# Patient Record
Sex: Male | Born: 1955 | Race: White | Hispanic: No | Marital: Married | Smoking: Former smoker
Health system: Southern US, Community
[De-identification: ages and names within clinical notes are randomized; demographics above are authoritative.]

## PROBLEM LIST (undated history)

## (undated) HISTORY — PX: NASAL SINUS SURGERY: SHX719

---

## 1998-02-23 ENCOUNTER — Ambulatory Visit (HOSPITAL_COMMUNITY): Admission: RE | Admit: 1998-02-23 | Discharge: 1998-02-23 | Payer: Self-pay | Admitting: Gastroenterology

## 1998-03-29 ENCOUNTER — Other Ambulatory Visit: Admission: RE | Admit: 1998-03-29 | Discharge: 1998-03-29 | Payer: Self-pay

## 1998-04-11 ENCOUNTER — Ambulatory Visit (HOSPITAL_COMMUNITY): Admission: RE | Admit: 1998-04-11 | Discharge: 1998-04-11 | Payer: Self-pay | Admitting: Gastroenterology

## 1999-09-04 HISTORY — PX: NASAL SINUS SURGERY: SHX719

## 2004-12-06 ENCOUNTER — Ambulatory Visit: Payer: Self-pay | Admitting: Internal Medicine

## 2005-05-01 ENCOUNTER — Ambulatory Visit: Payer: Self-pay | Admitting: Internal Medicine

## 2005-10-01 ENCOUNTER — Ambulatory Visit: Payer: Self-pay | Admitting: Internal Medicine

## 2005-10-02 ENCOUNTER — Ambulatory Visit: Payer: Self-pay | Admitting: Internal Medicine

## 2006-01-31 ENCOUNTER — Ambulatory Visit: Payer: Self-pay | Admitting: Internal Medicine

## 2006-05-23 ENCOUNTER — Ambulatory Visit: Payer: Self-pay | Admitting: Internal Medicine

## 2006-09-12 ENCOUNTER — Ambulatory Visit: Payer: Self-pay | Admitting: Internal Medicine

## 2007-01-16 ENCOUNTER — Ambulatory Visit: Payer: Self-pay | Admitting: Internal Medicine

## 2007-06-13 ENCOUNTER — Ambulatory Visit: Payer: Self-pay | Admitting: Internal Medicine

## 2008-04-27 DIAGNOSIS — J302 Other seasonal allergic rhinitis: Secondary | ICD-10-CM | POA: Insufficient documentation

## 2008-04-27 DIAGNOSIS — J3089 Other allergic rhinitis: Secondary | ICD-10-CM

## 2008-04-28 ENCOUNTER — Ambulatory Visit: Payer: Self-pay | Admitting: Internal Medicine

## 2008-04-29 ENCOUNTER — Ambulatory Visit: Payer: Self-pay | Admitting: Internal Medicine

## 2008-08-25 ENCOUNTER — Ambulatory Visit: Payer: Self-pay | Admitting: Internal Medicine

## 2009-01-24 ENCOUNTER — Ambulatory Visit: Payer: Self-pay | Admitting: Internal Medicine

## 2009-04-28 ENCOUNTER — Ambulatory Visit: Payer: Self-pay | Admitting: Internal Medicine

## 2009-04-29 ENCOUNTER — Ambulatory Visit: Payer: Self-pay | Admitting: Internal Medicine

## 2009-09-14 ENCOUNTER — Ambulatory Visit: Payer: Self-pay | Admitting: Internal Medicine

## 2010-01-02 ENCOUNTER — Ambulatory Visit: Payer: Self-pay | Admitting: Internal Medicine

## 2010-04-28 ENCOUNTER — Ambulatory Visit: Payer: Self-pay | Admitting: Internal Medicine

## 2010-05-31 ENCOUNTER — Ambulatory Visit: Payer: Self-pay | Admitting: Internal Medicine

## 2010-10-03 NOTE — Assessment & Plan Note (Signed)
Summary: 12 months/apc   Primary Provider/Referring Provider:  [facility] [place]  CC:  Yearly follow up visit-alleriges. no complaints..  History of Present Illness: 04/28/08- 55 year old man returning for follow-up of allergic rhinitis. no history of asthma.  Remote smoking history.  He had been on allergy vaccine, started at the old office and being given at 1:10 by his daughter with no problems.  He dropped off in December.  Since then.  He has felt tired, run down with itching of nose, and skin.  He feels this changes due to being off of allergy vaccine and he wants to restart. Denies purulent discharge, headache, wheezing, chest pain or palpitation, adenopathy, or rash. Admits difficulty initiating and maintaining sleep.  Not sure about snoring, status.  Not clear that his sense of "dragged down"  is really sleepiness.  Has not recognized oither health problems.  There is past history of sinus surgery.  8.26/10- allergic rhinitis Continues allergy shots- daughter or mother gives his injections with no problems and he rarely needs claritin. Only occasional sneeze. Feels dragging, blames hot weather. he is satisfied with repiratory symptom control.  April 28, 2010- Allergic rhinitis Mother continues to give his shots and he says as long as he gets his shot once a week he is fine. denies need for any supplemental meds- no longer needs Claritin. Denies concerns, headche, sinus congestion, wheeze of cough. On no treatment for any other medical treatment. His wife and daughter   (nurse) give his shots with no problems or reactions.    Preventive Screening-Counseling & Management  Alcohol-Tobacco     Smoking Status: quit     Year Quit: 35 years ago     Pack years: 4 years x 1ppd  Current Medications (verified): 1)  Epipen 0.3 Mg/0.41ml (1:1000) Devi (Epinephrine Hcl (Anaphylaxis)) .... For Severe Allergic Reaction 2)  Allergy Vaccine 1:10 Dtr Gives (W-E). .... 0.6 Ml/ Vial/  Week  Allergies (verified): No Known Drug Allergies  Social History: Pack years:  4 years x 1ppd  Review of Systems  The patient denies shortness of breath with activity, shortness of breath at rest, productive cough, non-productive cough, coughing up blood, chest pain, irregular heartbeats, acid heartburn, indigestion, loss of appetite, weight change, abdominal pain, difficulty swallowing, sore throat, tooth/dental problems, headaches, nasal congestion/difficulty breathing through nose, and sneezing.    Vital Signs:  Patient profile:   55 year old male Height:      71 inches Weight:      213 pounds BMI:     29.81 O2 Sat:      98 % on Room air Pulse rate:   71 / minute BP sitting:   122 / 72  (left arm) Cuff size:   regular  Vitals Entered By: Reynaldo Minium CMA (April 28, 2010 9:10 AM)  O2 Flow:  Room air CC: Yearly follow up visit-alleriges. no complaints.   Physical Exam  Additional Exam:  General: A/Ox3; pleasant and cooperative, NAD, SKIN: no rash, lesions NODES: no lymphadenopathy HEENT: [place]/AT, EOM- WNL, Conjuctivae- clear, PERRLA, TM-WNL, Nose- clear, Throat- clear and wnl, Mallampati III NECK: Supple w/ fair ROM, JVD- none, normal carotid impulses w/o bruits Thyroid-  CHEST: Clear to P&A HEART: RRR, no m/g/r heard ABDOMEN: Soft and nl;  UVO:ZDGU, nl pulses, no edema  NEURO: Grossly intact to observation      Impression & Recommendations:  Problem # 1:  ALLERGIC RHINITIS (ICD-477.9)  Doing very well. He is pleased and wants no changes. We will  renew his Epipen and continuue allergy shots. The following medications were removed from the medication list:    Claritin 10 Mg Tabs (Loratadine) .Marland Kitchen... Take 1 tablet by mouth once a day as needed  Other Orders: Est. Patient Level III (16109)  Patient Instructions: 1)  Please schedule a follow-up appointment in 1 year. 2)  Continue allergy vaccine 3)  Refill script for Epipen to hold in case of severe allergic  reaction to allergy vaccine 4)  You can take an otc antihistamine as needed for sneeze, itch or other allergy symptoms: claritin/ lotatadine, Allegra/ fexofenadine, Zyrtec/ cetirizine are all popular. Prescriptions: EPIPEN 0.3 MG/0.3ML (1:1000) DEVI (EPINEPHRINE HCL (ANAPHYLAXIS)) For severe allergic reaction  #1 x prn   Entered and Authorized by:   Waymon Budge MD   Signed by:   Waymon Budge MD on 04/28/2010   Method used:   Print then Give to Patient   RxID:   [id number]

## 2011-01-17 ENCOUNTER — Ambulatory Visit (INDEPENDENT_AMBULATORY_CARE_PROVIDER_SITE_OTHER): Payer: Self-pay

## 2011-01-17 DIAGNOSIS — J309 Allergic rhinitis, unspecified: Secondary | ICD-10-CM

## 2011-05-17 ENCOUNTER — Ambulatory Visit (INDEPENDENT_AMBULATORY_CARE_PROVIDER_SITE_OTHER): Payer: Self-pay

## 2011-05-17 DIAGNOSIS — J309 Allergic rhinitis, unspecified: Secondary | ICD-10-CM

## 2011-06-14 ENCOUNTER — Encounter: Payer: Self-pay | Admitting: Internal Medicine

## 2011-06-14 ENCOUNTER — Ambulatory Visit (INDEPENDENT_AMBULATORY_CARE_PROVIDER_SITE_OTHER): Payer: Self-pay | Admitting: Internal Medicine

## 2011-06-14 VITALS — BP 122/76 | HR 59 | Ht 71.0 in | Wt 206.2 lb

## 2011-06-14 DIAGNOSIS — J309 Allergic rhinitis, unspecified: Secondary | ICD-10-CM

## 2011-06-14 MED ORDER — EPINEPHRINE 0.3 MG/0.3ML IJ DEVI: 0.3000 mg | Freq: Once | INTRAMUSCULAR | Status: AC

## 2011-06-14 NOTE — Patient Instructions (Signed)
Continue allergy vaccine- call as needed

## 2011-06-14 NOTE — Progress Notes (Signed)
06/14/11-55 year old male former smoker followed for allergic rhinitis without asthma Last here -04/28/2010 He feels he is doing very well with no acute problems. We reviewed his history on allergy vaccine. His daughter is a Engineer, civil (consulting), giving his shots. He had tried stopping his shots in 2009 and experienced progressive itching and malaise which were relieved when he restarted. He continues at 0.6 mL per vial per week without complication or concern.  ROS See HPI Constitutional:   No-   weight loss, night sweats, fevers, chills, fatigue, lassitude. HEENT:   No-  headaches, difficulty swallowing, tooth/dental problems, sore throat,       No-  sneezing, itching, ear ache, nasal congestion, post nasal drip,  CV:  No-   chest pain, orthopnea, PND, swelling in lower extremities, anasarca, dizziness, palpitations Resp: No-   shortness of breath with exertion or at rest.              No-   productive cough,  No non-productive cough,  No-  coughing up of blood.              No-   change in color of mucus.  No- wheezing.   Skin: No-   rash or lesions. GI:  No-   heartburn, indigestion, abdominal pain, nausea, vomiting, diarrhea,                 change in bowel habits, loss of appetite GU: No-   dysuria, change in color of urine, no urgency or frequency.  No- flank pain. MS:  No-   joint pain or swelling.  No- decreased range of motion.  No- back pain. Neuro-  Psych:  No- change in mood or affect. No depression or anxiety.  No memory loss.  OBJ General- Alert, Oriented, Affect-appropriate, Distress- none acute, well-appearing Skin- rash-none, lesions- none, excoriation- none Lymphadenopathy- none Head- atraumatic            Eyes- Gross vision intact, PERRLA, conjunctivae clear secretions            Ears- Hearing, canals-normal            Nose- Clear, no-Septal dev, mucus, polyps, erosion, perforation             Throat- Mallampati II , mucosa clear , drainage- none, tonsils- atrophic Neck- flexible ,  trachea midline, no stridor , thyroid nl, carotid no bruit Chest - symmetrical excursion , unlabored           Heart/CV- RRR , no murmur , no gallop  , no rub, nl s1 s2                           - JVD- none , edema- none, stasis changes- none, varices- none           Lung- clear to P&A, wheeze- none, cough- none , dullness-none, rub- none           Chest wall-  Abd- tender-no, distended-no, bowel sounds-present, HSM- no Br/ Gen/ Rectal- Not done, not indicated Extrem- cyanosis- none, clubbing, none, atrophy- none, strength- nl Neuro- grossly intact to observation

## 2011-06-16 ENCOUNTER — Encounter: Payer: Self-pay | Admitting: Internal Medicine

## 2011-06-16 NOTE — Assessment & Plan Note (Signed)
We discussed goals and strategy for allergy vaccine as well as decisions about discontinuing. He is happier continuing his shots at this time.

## 2011-06-20 ENCOUNTER — Ambulatory Visit (INDEPENDENT_AMBULATORY_CARE_PROVIDER_SITE_OTHER): Payer: Self-pay

## 2011-06-20 DIAGNOSIS — J309 Allergic rhinitis, unspecified: Secondary | ICD-10-CM

## 2012-01-09 ENCOUNTER — Ambulatory Visit (INDEPENDENT_AMBULATORY_CARE_PROVIDER_SITE_OTHER): Payer: Self-pay

## 2012-01-09 DIAGNOSIS — J309 Allergic rhinitis, unspecified: Secondary | ICD-10-CM

## 2012-05-21 ENCOUNTER — Ambulatory Visit (INDEPENDENT_AMBULATORY_CARE_PROVIDER_SITE_OTHER): Payer: Self-pay

## 2012-05-21 DIAGNOSIS — J309 Allergic rhinitis, unspecified: Secondary | ICD-10-CM

## 2012-07-16 ENCOUNTER — Encounter: Payer: Self-pay | Admitting: Internal Medicine

## 2012-07-16 ENCOUNTER — Ambulatory Visit (INDEPENDENT_AMBULATORY_CARE_PROVIDER_SITE_OTHER): Payer: Self-pay | Admitting: Internal Medicine

## 2012-07-16 VITALS — BP 120/74 | HR 61 | Ht 71.0 in | Wt 209.8 lb

## 2012-07-16 DIAGNOSIS — J309 Allergic rhinitis, unspecified: Secondary | ICD-10-CM

## 2012-07-16 NOTE — Patient Instructions (Addendum)
We can continue your allergy vaccine at 1:10     Please call as needed

## 2012-07-16 NOTE — Progress Notes (Signed)
06/14/11-56 year old male former smoker followed for allergic rhinitis without asthma Last here -04/28/2010 He feels he is doing very well with no acute problems. We reviewed his history on allergy vaccine. His daughter is a Engineer, civil (consulting), giving his shots. He had tried stopping his shots in 2009 and experienced progressive itching and malaise which were relieved when he restarted. He continues at 0.6 mL per vial per week without complication or concern.  07/16/12- 56 year old male former smoker followed for allergic rhinitis without asthma    Here with mother Still on allergy vaccine 1:10 mother gives,  and doing well; no reactions or flare ups at this time. He feels he is much better with allergy vaccine than without. No reactions but does have EpiPen kept at his mother,s where he gets his shots.  ROS-See HPI Constitutional:   No-   weight loss, night sweats, fevers, chills, fatigue, lassitude. HEENT:   No-  headaches, difficulty swallowing, tooth/dental problems, sore throat,       No-  sneezing, itching, ear ache, nasal congestion, post nasal drip,  CV:  No-   chest pain, orthopnea, PND, swelling in lower extremities, anasarca, dizziness, palpitations Resp: No-   shortness of breath with exertion or at rest.              No-   productive cough,  No non-productive cough,  No-  coughing up of blood.              No-   change in color of mucus.  No- wheezing.   Skin: No-   rash or lesions. GI:  No-   heartburn, indigestion, abdominal pain, nausea, vomiting,  GU: . MS:  No-   joint pain or swelling.   Neuro-  Psych:  No- change in mood or affect. No depression or anxiety.  No memory loss.  OBJ General- Alert, Oriented, Affect-appropriate, Distress- none acute, well-appearing Skin- rash-none, lesions- none, excoriation- none Lymphadenopathy- none Head- atraumatic            Eyes- Gross vision intact, PERRLA, conjunctivae clear secretions            Ears- Hearing, canals-normal            Nose-  Clear, no-Septal dev, mucus, polyps, erosion, perforation             Throat- Mallampati III , mucosa clear , drainage- none, tonsils- atrophic Neck- flexible , trachea midline, no stridor , thyroid nl, carotid no bruit Chest - symmetrical excursion , unlabored           Heart/CV- RRR , no murmur , no gallop  , no rub, nl s1 s2                           - JVD- none , edema- none, stasis changes- none, varices- none           Lung- clear to P&A, wheeze- none, cough- none , dullness-none, rub- none           Chest wall-  Abd- Br/ Gen/ Rectal- Not done, not indicated Extrem- cyanosis- none, clubbing, none, atrophy- none, strength- nl Neuro- grossly intact to observation

## 2012-07-26 NOTE — Assessment & Plan Note (Signed)
Well controlled. No changes or concerns. We reviewed the risk and benefit considerations

## 2012-09-17 ENCOUNTER — Ambulatory Visit (INDEPENDENT_AMBULATORY_CARE_PROVIDER_SITE_OTHER): Payer: Self-pay

## 2012-09-17 DIAGNOSIS — J309 Allergic rhinitis, unspecified: Secondary | ICD-10-CM

## 2013-01-12 ENCOUNTER — Ambulatory Visit (INDEPENDENT_AMBULATORY_CARE_PROVIDER_SITE_OTHER): Payer: Self-pay

## 2013-01-12 DIAGNOSIS — J309 Allergic rhinitis, unspecified: Secondary | ICD-10-CM

## 2013-04-23 ENCOUNTER — Ambulatory Visit (INDEPENDENT_AMBULATORY_CARE_PROVIDER_SITE_OTHER): Payer: Self-pay

## 2013-04-23 DIAGNOSIS — J309 Allergic rhinitis, unspecified: Secondary | ICD-10-CM

## 2013-07-16 ENCOUNTER — Ambulatory Visit (INDEPENDENT_AMBULATORY_CARE_PROVIDER_SITE_OTHER): Payer: Self-pay | Admitting: Internal Medicine

## 2013-07-16 ENCOUNTER — Encounter: Payer: Self-pay | Admitting: Internal Medicine

## 2013-07-16 VITALS — BP 118/72 | HR 70 | Ht 71.0 in | Wt 210.6 lb

## 2013-07-16 DIAGNOSIS — J309 Allergic rhinitis, unspecified: Secondary | ICD-10-CM

## 2013-07-16 NOTE — Patient Instructions (Signed)
We can continue allergy vaccine 1:10 GO  Please call as needed 

## 2013-07-16 NOTE — Progress Notes (Signed)
06/14/11-57 year old male former smoker followed for allergic rhinitis without asthma Last here -04/28/2010 He feels he is doing very well with no acute problems. We reviewed his history on allergy vaccine. His daughter is a Engineer, civil (consulting), giving his shots. He had tried stopping his shots in 2009 and experienced progressive itching and malaise which were relieved when he restarted. He continues at 0.6 mL per vial per week without complication or concern.  07/16/12- 57 year old male former smoker followed for allergic rhinitis without asthma    Here with mother Still on allergy vaccine 1:10 mother gives,  and doing well; no reactions or flare ups at this time. He feels he is much better with allergy vaccine than without. No reactions but does have EpiPen kept at his mother,s where he gets his shots.  07/16/13- 57 year old male former smoker followed for allergic rhinitis without asthma    Here with mother Still on allergy vaccine 1:10 mother gives, FOLLOWS FOR:  Doing well, still on vaccine.  No concerns today He declines flu shot-discussed. Breathing feels stable. 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 No routine cough or wheeze.  ROS-See HPI Constitutional:   No-   weight loss, night sweats, fevers, chills, fatigue, lassitude. HEENT:   No-  headaches, difficulty swallowing, tooth/dental problems, sore throat,       No-  sneezing, itching, ear ache, nasal congestion, post nasal drip,  CV:  No-   chest pain, orthopnea, PND, swelling in lower extremities, anasarca, dizziness, palpitations Resp: No-   shortness of breath with exertion or at rest.              No-   productive cough,  No non-productive cough,  No-  coughing up of blood.              No-   change in color of mucus.  No- wheezing.   Skin: No-   rash or lesions. GI:  No-   heartburn, indigestion, abdominal pain, nausea, vomiting,  GU: . MS:  No-   joint pain or swelling.   Neuro-  Psych:  No- change in mood or affect.  No depression or anxiety.  No memory loss.  OBJ General- Alert, Oriented, Affect-appropriate, Distress- none acute, well-appearing Skin- rash-none, lesions- none, excoriation- none Lymphadenopathy- none Head- atraumatic            Eyes- Gross vision intact, PERRLA, conjunctivae clear secretions            Ears- Hearing, canals-normal            Nose- Clear, no-Septal dev, mucus+ , polyps, erosion, perforation             Throat- Mallampati III , mucosa clear , drainage- none, tonsils- atrophic Neck- flexible , trachea midline, no stridor , thyroid nl, carotid no bruit Chest - symmetrical excursion , unlabored           Heart/CV- RRR , no murmur , no gallop  , no rub, nl s1 s2                           - JVD- none , edema- none, stasis changes- none, varices- none           Lung- clear to P&A, wheeze- none, cough- none , dullness-none, rub- none           Chest wall-  Abd- Br/ Gen/ Rectal- Not done, not indicated Extrem- cyanosis- none, clubbing, none, atrophy-  none, strength- nl Neuro- grossly intact to observation

## 2013-07-20 ENCOUNTER — Ambulatory Visit (INDEPENDENT_AMBULATORY_CARE_PROVIDER_SITE_OTHER): Payer: Self-pay

## 2013-07-20 DIAGNOSIS — J309 Allergic rhinitis, unspecified: Secondary | ICD-10-CM

## 2013-08-01 NOTE — Assessment & Plan Note (Signed)
Seasonal and perennial allergic rhinitis. He does well with allergy vaccine and is satisfied to continue.

## 2013-10-26 ENCOUNTER — Ambulatory Visit (INDEPENDENT_AMBULATORY_CARE_PROVIDER_SITE_OTHER): Payer: Self-pay

## 2013-10-26 DIAGNOSIS — J309 Allergic rhinitis, unspecified: Secondary | ICD-10-CM

## 2014-02-11 ENCOUNTER — Ambulatory Visit (INDEPENDENT_AMBULATORY_CARE_PROVIDER_SITE_OTHER): Payer: Self-pay

## 2014-02-11 DIAGNOSIS — J309 Allergic rhinitis, unspecified: Secondary | ICD-10-CM

## 2014-06-21 ENCOUNTER — Ambulatory Visit (INDEPENDENT_AMBULATORY_CARE_PROVIDER_SITE_OTHER): Payer: Self-pay

## 2014-06-21 DIAGNOSIS — J309 Allergic rhinitis, unspecified: Secondary | ICD-10-CM

## 2014-07-08 ENCOUNTER — Encounter: Payer: Self-pay | Admitting: Internal Medicine

## 2014-07-14 ENCOUNTER — Encounter: Payer: Self-pay | Admitting: Internal Medicine

## 2014-07-14 ENCOUNTER — Ambulatory Visit (INDEPENDENT_AMBULATORY_CARE_PROVIDER_SITE_OTHER): Payer: Self-pay | Admitting: Internal Medicine

## 2014-07-14 VITALS — BP 122/86 | HR 75 | Ht 71.0 in | Wt 215.4 lb

## 2014-07-14 DIAGNOSIS — J3089 Other allergic rhinitis: Principal | ICD-10-CM

## 2014-07-14 DIAGNOSIS — J302 Other seasonal allergic rhinitis: Secondary | ICD-10-CM

## 2014-07-14 DIAGNOSIS — J309 Allergic rhinitis, unspecified: Secondary | ICD-10-CM

## 2014-07-14 NOTE — Patient Instructions (Signed)
We can continue allergy vaccine 1:10 GO  Please call as needed 

## 2014-07-14 NOTE — Progress Notes (Signed)
06/14/11-58 year old male former smoker followed for allergic rhinitis without asthma Last here -04/28/2010 He feels he is doing very well with no acute problems. We reviewed his history on allergy vaccine. His daughter is a Engineer, civil (consulting)nurse, giving his shots. He had tried stopping his shots in 2009 and experienced progressive itching and malaise which were relieved when he restarted. He continues at 0.6 mL per vial per week without complication or concern.  07/16/12- 58 year old male former smoker followed for allergic rhinitis without asthma    Here with mother Still on allergy vaccine 1:10 mother gives,  and doing well; no reactions or flare ups at this time. He feels he is much better with allergy vaccine than without. No reactions but does have EpiPen kept at his mother,s where he gets his shots.  07/16/13- 58 year old male former smoker followed for allergic rhinitis without asthma    Here with mother Still on allergy vaccine 1:10 mother gives, FOLLOWS FOR:  Doing well, still on vaccine.  No concerns today He declines flu shot-discussed. Breathing feels stable. 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 No routine cough or wheeze.  07/14/14- 58 year old male former smoker followed for allergic rhinitis without asthma    Here with mother Yates DecampJoan Coble Allergy vaccine 1:10 GO/ Dtr gives He has never had asthma. Satisfied to continue current treatment.  ROS-See HPI Constitutional:   No-   weight loss, night sweats, fevers, chills, fatigue, lassitude. HEENT:   No-  headaches, difficulty swallowing, tooth/dental problems, sore throat,       No-  sneezing, itching, ear ache, nasal congestion, post nasal drip,  CV:  No-   chest pain, orthopnea, PND, swelling in lower extremities, anasarca, dizziness, palpitations Resp: No-   shortness of breath with exertion or at rest.              No-   productive cough,  No non-productive cough,  No-  coughing up of blood.              No-   change in  color of mucus.  No- wheezing.   Skin: No-   rash or lesions. GI:  No-   heartburn, indigestion, abdominal pain, nausea, vomiting,  GU: . MS:  No-   joint pain or swelling.   Neuro-  Psych:  No- change in mood or affect. No depression or anxiety.  No memory loss.  OBJ General- Alert, Oriented, Affect-appropriate, Distress- none acute, well-appearing Skin- rash-none, lesions- none, excoriation- none Lymphadenopathy- none Head- atraumatic            Eyes- Gross vision intact, PERRLA, conjunctivae clear secretions            Ears- Hearing, canals-normal            Nose- Clear, no-Septal dev, mucus+ , polyps, erosion, perforation             Throat- Mallampati III , mucosa clear , drainage- none, tonsils- atrophic Neck- flexible , trachea midline, no stridor , thyroid nl, carotid no bruit Chest - symmetrical excursion , unlabored           Heart/CV- RRR , no murmur , no gallop  , no rub, nl s1 s2                           - JVD- none , edema- none, stasis changes- none, varices- none           Lung- clear to  P&A, wheeze- none, cough- none , dullness-none, rub- none           Chest wall-  Abd- Br/ Gen/ Rectal- Not done, not indicated Extrem- cyanosis- none, clubbing, none, atrophy- none, strength- nl Neuro- grossly intact to observation

## 2014-07-14 NOTE — Assessment & Plan Note (Signed)
He indicates confidence that allergy vaccine still works for him and is worth continuing. Little need for more than occasional supplemental antihistamine. He declines flu vaccine-discussed.

## 2014-10-13 ENCOUNTER — Ambulatory Visit (INDEPENDENT_AMBULATORY_CARE_PROVIDER_SITE_OTHER): Payer: Self-pay

## 2014-10-13 DIAGNOSIS — J309 Allergic rhinitis, unspecified: Secondary | ICD-10-CM

## 2015-02-01 ENCOUNTER — Telehealth: Payer: Self-pay | Admitting: Internal Medicine

## 2015-02-02 ENCOUNTER — Ambulatory Visit (INDEPENDENT_AMBULATORY_CARE_PROVIDER_SITE_OTHER): Payer: Self-pay

## 2015-02-02 ENCOUNTER — Telehealth: Payer: Self-pay | Admitting: Internal Medicine

## 2015-02-02 DIAGNOSIS — J309 Allergic rhinitis, unspecified: Secondary | ICD-10-CM

## 2015-02-02 NOTE — Telephone Encounter (Signed)
Barry Campbell  called my ext. Yesterday but did not leave a message. I wrote down the # and pulled up her chart.(so I'd know who I was talking to.)  When I called(02/01/15 am) she preceded to tell me she was calling about her son's vac.. I explained he should get it either 02/01/15 or today. Nothing further was needed.

## 2015-03-02 NOTE — Telephone Encounter (Signed)
Date Mixed: 02/02/15 Vial: 1 Strength: 1:10 Here/Mail/Pick Up: mail Mixed By: Darlyne Russiantbs

## 2015-04-22 ENCOUNTER — Encounter: Payer: Self-pay | Admitting: Internal Medicine

## 2015-06-01 ENCOUNTER — Telehealth: Payer: Self-pay | Admitting: Internal Medicine

## 2015-06-01 ENCOUNTER — Ambulatory Visit (INDEPENDENT_AMBULATORY_CARE_PROVIDER_SITE_OTHER): Payer: Self-pay

## 2015-06-01 DIAGNOSIS — J309 Allergic rhinitis, unspecified: Secondary | ICD-10-CM

## 2015-06-01 NOTE — Telephone Encounter (Signed)
Allergy Serum Extract Date Mixed: 05/31/1620 Vial: 1 Strength: 1:10 Here/Mail/Pick Up: mail Mixed By: Darlyne Russian

## 2015-07-19 ENCOUNTER — Encounter: Payer: Self-pay | Admitting: Internal Medicine

## 2015-09-14 ENCOUNTER — Ambulatory Visit: Payer: Self-pay | Admitting: Internal Medicine

## 2015-09-22 ENCOUNTER — Ambulatory Visit (INDEPENDENT_AMBULATORY_CARE_PROVIDER_SITE_OTHER): Payer: Self-pay | Admitting: Internal Medicine

## 2015-09-22 ENCOUNTER — Encounter: Payer: Self-pay | Admitting: Internal Medicine

## 2015-09-22 VITALS — BP 120/78 | HR 74 | Ht 71.0 in | Wt 203.8 lb

## 2015-09-22 DIAGNOSIS — J3089 Other allergic rhinitis: Principal | ICD-10-CM

## 2015-09-22 DIAGNOSIS — J309 Allergic rhinitis, unspecified: Secondary | ICD-10-CM

## 2015-09-22 DIAGNOSIS — J302 Other seasonal allergic rhinitis: Secondary | ICD-10-CM

## 2015-09-22 NOTE — Patient Instructions (Signed)
We can continue allergy vaccine 1:10 GO  Please call as needed 

## 2015-09-22 NOTE — Progress Notes (Signed)
06/14/11-60 year old male former smoker followed for allergic rhinitis without asthma Last here -04/28/2010 He feels he is doing very well with no acute problems. We reviewed his history on allergy vaccine. His daughter is a Engineer, civil (consulting), giving his shots. He had tried stopping his shots in 2009 and experienced progressive itching and malaise which were relieved when he restarted. He continues at 0.6 mL per vial per week without complication or concern.  07/16/12- 60 year old male former smoker followed for allergic rhinitis without asthma    Here with mother Still on allergy vaccine 1:10 mother gives,  and doing well; no reactions or flare ups at this time. He feels he is much better with allergy vaccine than without. No reactions but does have EpiPen kept at his mother,s where he gets his shots.  07/16/13- 60 year old male former smoker followed for allergic rhinitis without asthma    Here with mother Still on allergy vaccine 1:10 mother gives, FOLLOWS FOR:  Doing well, still on vaccine.  No concerns today He declines flu shot-discussed. Breathing feels stable. 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 No routine cough or wheeze.  07/14/14- 60 year old male former smoker followed for allergic rhinitis without asthma    Here with mother Barry Campbell Allergy vaccine 1:10 GO/ Dtr gives He has never had asthma. Satisfied to continue current treatment.  12/21/2015-60 year old male former smoker followed for allergic rhinitis without asthma Here with mother Barry Campbell Allergy vaccine 1:10 GO/daughter gives He is satisfied to continue allergy vaccine and feels he is having fewer respiratory problems while doing so. He denies other health concerns.  ROS-See HPI Constitutional:   No-   weight loss, night sweats, fevers, chills, fatigue, lassitude. HEENT:   No-  headaches, difficulty swallowing, tooth/dental problems, sore throat,       No-  sneezing, itching, ear ache, nasal congestion,  post nasal drip,  CV:  No-   chest pain, orthopnea, PND, swelling in lower extremities, anasarca, dizziness, palpitations Resp: No-   shortness of breath with exertion or at rest.              No-   productive cough,  No non-productive cough,  No-  coughing up of blood.              No-   change in color of mucus.  No- wheezing.   Skin: No-   rash or lesions. GI:  No-   heartburn, indigestion, abdominal pain, nausea, vomiting,  GU: . MS:  No-   joint pain or swelling.   Neuro-  Psych:  No- change in mood or affect. No depression or anxiety.  No memory loss.  OBJ General- Alert, Oriented, Affect-appropriate, Distress- none acute, well-appearing Skin- rash-none, lesions- none, excoriation- none Lymphadenopathy- none Head- atraumatic            Eyes- Gross vision intact, PERRLA, conjunctivae clear secretions            Ears- Hearing, canals-normal            Nose- Clear, no-Septal dev, mucus+ , polyps, erosion, perforation             Throat- Mallampati III , mucosa clear , drainage- none, tonsils- atrophic Neck- flexible , trachea midline, no stridor , thyroid nl, carotid no bruit Chest - symmetrical excursion , unlabored           Heart/CV- RRR , no murmur , no gallop  , no rub, nl s1 s2                           -  JVD- none , edema- none, stasis changes- none, varices- none           Lung- clear to P&A, wheeze- none, cough- none , dullness-none, rub- none           Chest wall-  Abd- Br/ Gen/ Rectal- Not done, not indicated Extrem- cyanosis- none, clubbing, none, atrophy- none, strength- nl Neuro- grossly intact to observation

## 2015-09-25 NOTE — Assessment & Plan Note (Signed)
He believes allergy vaccine continues to be an active benefit. We discussed realistic expectations and goals, duration of therapy. Plan-okay to continue allergy vaccine for another year

## 2015-11-25 ENCOUNTER — Telehealth: Payer: Self-pay | Admitting: Internal Medicine

## 2015-11-25 DIAGNOSIS — J309 Allergic rhinitis, unspecified: Secondary | ICD-10-CM

## 2015-11-25 NOTE — Telephone Encounter (Signed)
Allergy Serum Extract Date Mixed: 11/25/2015 Vial: A Strength: 1:10 Here/Mail/Pick Up: Mail Mixed By: Jaynee EaglesLindsay Ceciley Buist, CMA Last OV: 09/22/2015 Pending OV: No pending

## 2016-03-19 ENCOUNTER — Telehealth: Payer: Self-pay | Admitting: Internal Medicine

## 2016-03-19 NOTE — Telephone Encounter (Signed)
Called spoke with laura (pt daughter). She reports pt is needing his allergy vaccine mailed to them. Please advise Tammy thanks

## 2016-03-20 NOTE — Telephone Encounter (Signed)
Please advise Tammy thanks 

## 2016-03-20 NOTE — Telephone Encounter (Signed)
Pt daughter returning call and says that the address that we have on file is the correct mailing address.Caren GriffinsStanley A Dalton

## 2016-03-20 NOTE — Telephone Encounter (Signed)
I was with a pt. And couldn't answer. Thanks for the confirmation. I will send vac. Today if not tomorrow. Nothing further needed.

## 2016-03-20 NOTE — Telephone Encounter (Signed)
Called spoke with Vernona RiegerLaura and made aware of below. She verbalized understanding and needed nothing further

## 2016-03-21 ENCOUNTER — Telehealth: Payer: Self-pay | Admitting: Internal Medicine

## 2016-03-21 DIAGNOSIS — J309 Allergic rhinitis, unspecified: Secondary | ICD-10-CM

## 2016-03-21 NOTE — Telephone Encounter (Signed)
Allergy Serum Extract Date Mixed: 03/21/16 Vial: 1 Strength: 1:10 Here/Mail/Pick Up: mail Mixed By: tbs Last OV: 07/14/14 Pending OV: 09/22/15

## 2016-08-07 DIAGNOSIS — J309 Allergic rhinitis, unspecified: Secondary | ICD-10-CM

## 2016-08-08 ENCOUNTER — Telehealth: Payer: Self-pay | Admitting: Internal Medicine

## 2016-08-08 NOTE — Telephone Encounter (Signed)
Allergy Serum Extract Date Mixed: 08/08/16 Vial: 1 Strength: 1:10 Here/Mail/Pick Up: mail Mixed By: tbs Last OV: 09/22/15 Pending OV: N/A

## 2018-04-02 ENCOUNTER — Other Ambulatory Visit (HOSPITAL_COMMUNITY): Payer: Self-pay | Admitting: Otolaryngology

## 2018-04-02 DIAGNOSIS — J322 Chronic ethmoidal sinusitis: Secondary | ICD-10-CM

## 2018-04-02 DIAGNOSIS — J320 Chronic maxillary sinusitis: Secondary | ICD-10-CM

## 2018-04-03 ENCOUNTER — Other Ambulatory Visit (HOSPITAL_COMMUNITY): Payer: Self-pay | Admitting: Otolaryngology

## 2018-04-03 DIAGNOSIS — J320 Chronic maxillary sinusitis: Secondary | ICD-10-CM

## 2018-04-03 DIAGNOSIS — J322 Chronic ethmoidal sinusitis: Secondary | ICD-10-CM

## 2018-04-07 ENCOUNTER — Ambulatory Visit (HOSPITAL_COMMUNITY)
Admission: RE | Admit: 2018-04-07 | Discharge: 2018-04-07 | Disposition: A | Discharge status: Home / Self Care | Payer: Self-pay | Source: Ambulatory Visit | Attending: Otolaryngology | Admitting: Otolaryngology

## 2018-04-07 DIAGNOSIS — J320 Chronic maxillary sinusitis: Secondary | ICD-10-CM | POA: Insufficient documentation

## 2018-04-07 DIAGNOSIS — R51 Headache: Secondary | ICD-10-CM | POA: Insufficient documentation

## 2018-04-07 DIAGNOSIS — J322 Chronic ethmoidal sinusitis: Secondary | ICD-10-CM | POA: Insufficient documentation

## 2019-11-27 IMAGING — CT CT MAXILLOFACIAL W/O CM
3 series · 15 of 47 positions shown, 18 images · non-contrast
Comparison: None.

CLINICAL DATA: Sinus pain and headache for 7 weeks.

EXAM:
CT MAXILLOFACIAL WITHOUT CONTRAST
TECHNIQUE: Multidetector CT images of the paranasal sinuses were obtained using
the standard protocol without intravenous contrast.

[Series 3: max soft · axial · 0.34mm/px · z∈[-180,-30]mm · 9 of 88 slices shown, 12 images]
[im 7/88  brain]
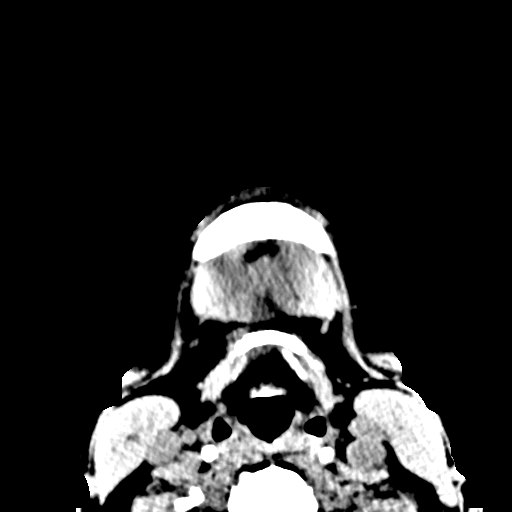
[im 7/88  bone]
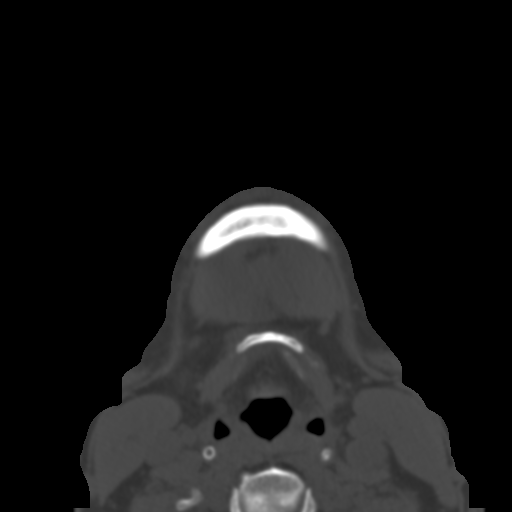
[im 16/88  bone]
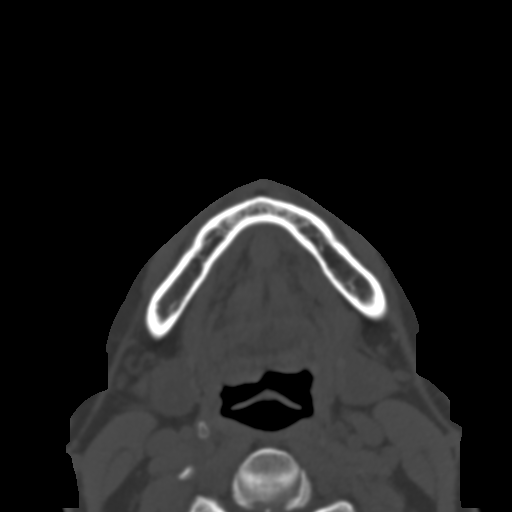
[im 25/88  bone]
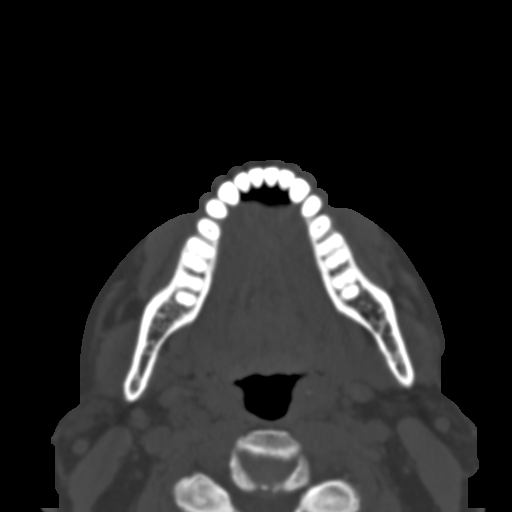
[im 34/88  bone]
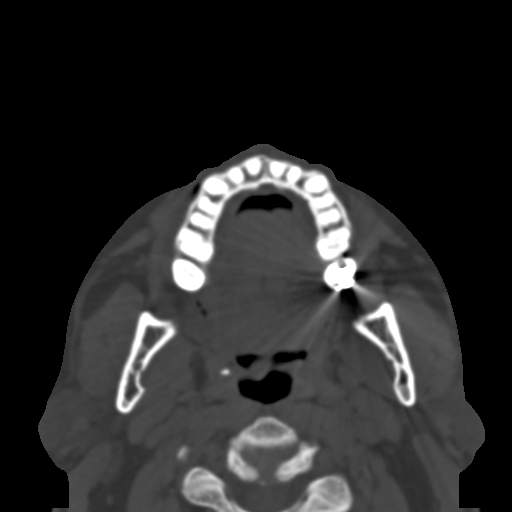
[im 46/88  brain]
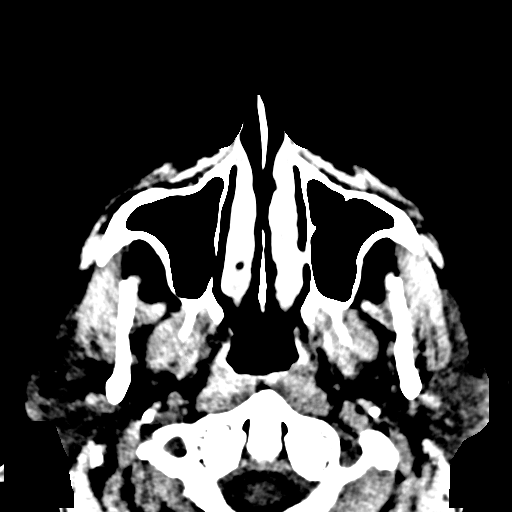
[im 46/88  bone]
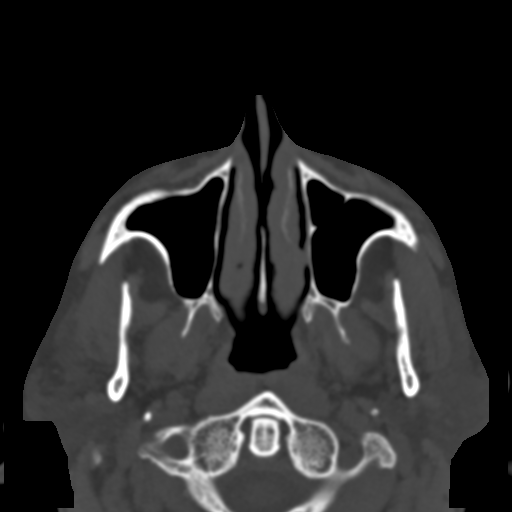
[im 55/88  bone]
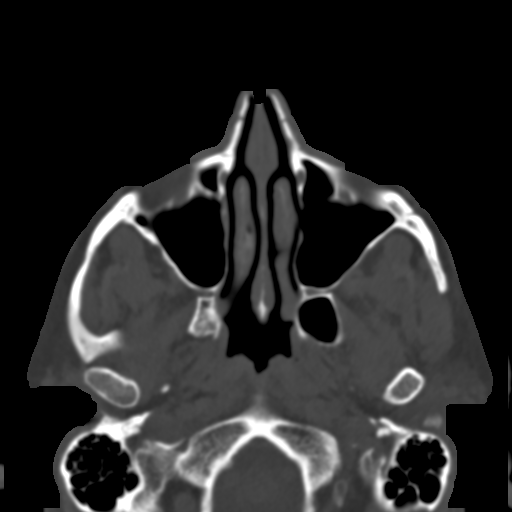
[im 64/88  bone]
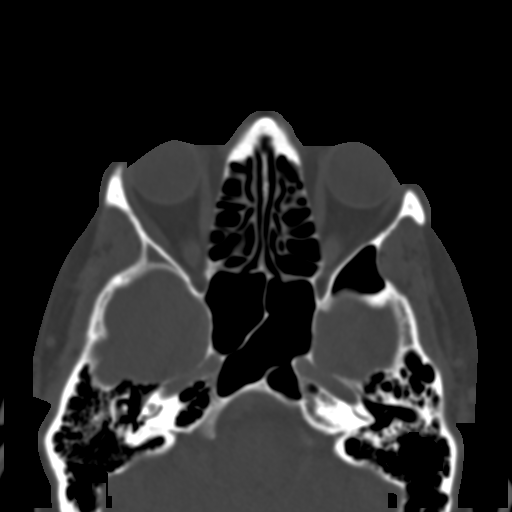
[im 73/88  bone]
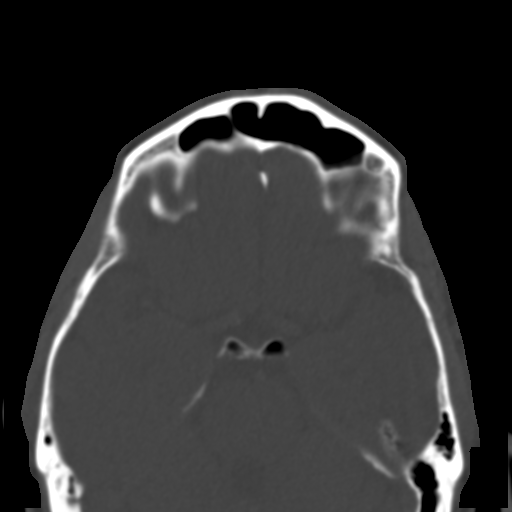
[im 82/88  brain]
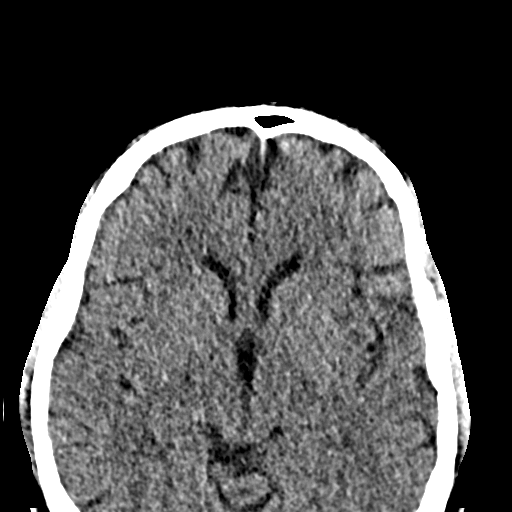
[im 82/88  bone]
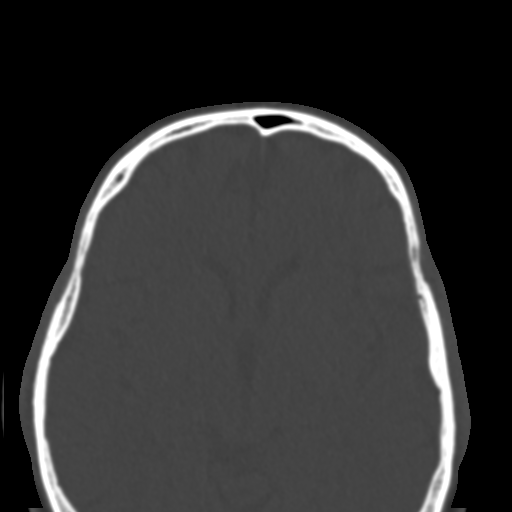

[Series 7: coronal soft · coronal · 0.35mm/px · 3 of 64 slices shown]
[im 22/64  bone]
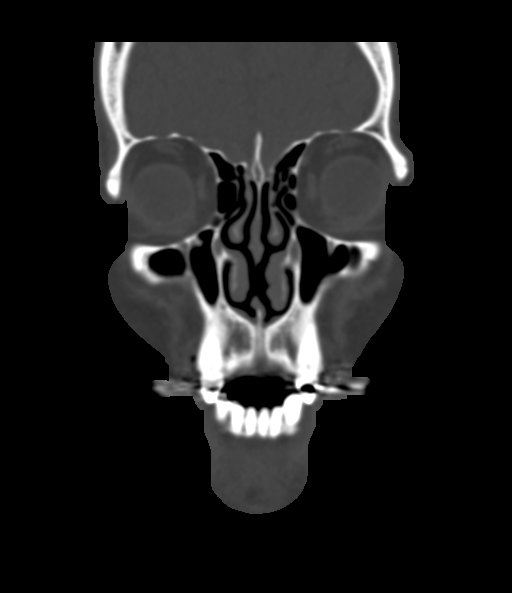
[im 29/64  bone]
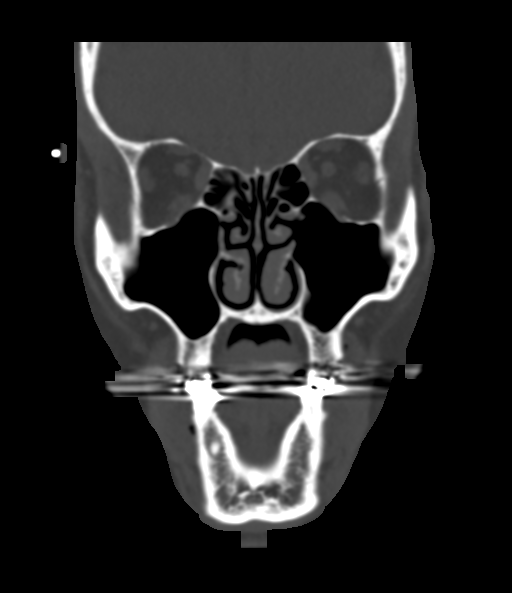
[im 36/64  bone]
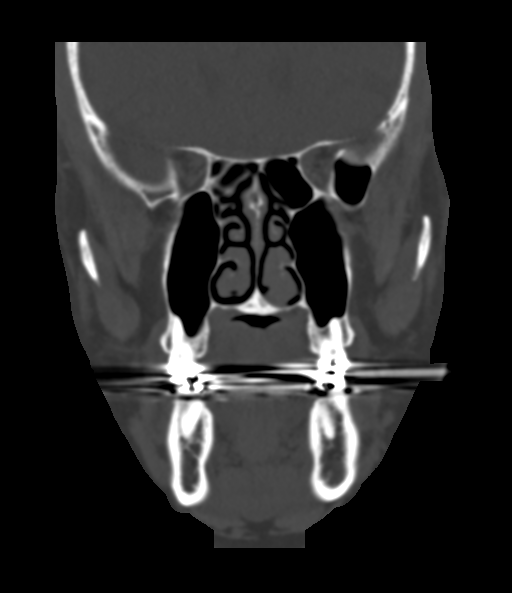

[Series 8: sagittal soft · sagittal · 0.31mm/px · 3 of 89 slices shown]
[im 30/89  bone]
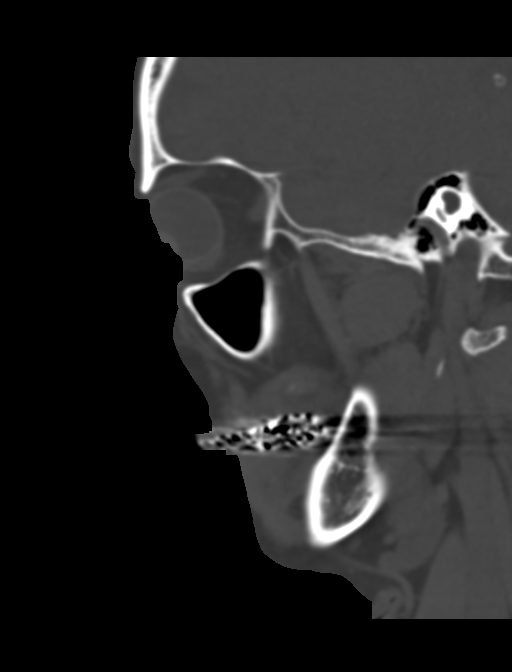
[im 45/89  bone]
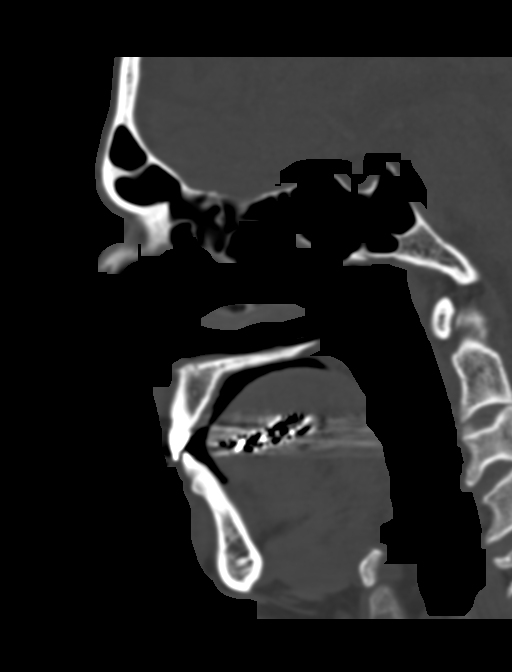
[im 59/89  bone]
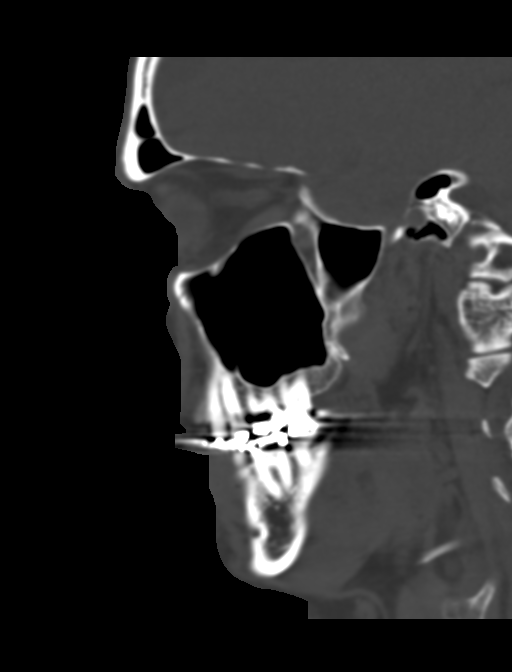

[15 of 47 positions shown; findings below may reference images not displayed]

FINDINGS: Paranasal sinuses:

Frontal: Normally aerated. Patent frontal sinus drainage pathways.

Ethmoid: Minor mucosal thickening in the paranasal sinuses.

Maxillary: Normally aerated.

Sphenoid: Notably large lateral recess of the left sphenoid sinus.
Patent ostia

Right ostiomeatal unit: Patent. Both the maxillary
infundibulum/ostium and a large secondary ostium are patent.

Left ostiomeatal unit: Patent.

Nasal passages: Patent. Small defect in the anterior inferior nasal
septum. The septum is midline.

Anatomy: No pneumatization superior to anterior ethmoid notches.
Sellar sphenoid pneumatization pattern. No dehiscence of carotid or
optic canals. No onodi cell.

Other: Orbits and intracranial compartment are unremarkable. Visible
mastoid air cells are normally aerated.
IMPRESSION: Negative exam.  No sinusitis or sinus outflow obstruction.

## 2022-01-31 ENCOUNTER — Encounter: Payer: Self-pay | Admitting: Internal Medicine

## 2022-01-31 ENCOUNTER — Ambulatory Visit: Payer: Medicare Other | Admitting: Internal Medicine

## 2022-01-31 VITALS — BP 126/82 | HR 74 | Temp 98.0°F | Resp 20 | Ht 71.0 in | Wt 215.2 lb

## 2022-01-31 DIAGNOSIS — J3089 Other allergic rhinitis: Secondary | ICD-10-CM | POA: Diagnosis not present

## 2022-01-31 DIAGNOSIS — H1010 Acute atopic conjunctivitis, unspecified eye: Secondary | ICD-10-CM | POA: Insufficient documentation

## 2022-01-31 DIAGNOSIS — J302 Other seasonal allergic rhinitis: Secondary | ICD-10-CM

## 2022-01-31 DIAGNOSIS — T781XXA Other adverse food reactions, not elsewhere classified, initial encounter: Secondary | ICD-10-CM

## 2022-01-31 DIAGNOSIS — H1013 Acute atopic conjunctivitis, bilateral: Secondary | ICD-10-CM

## 2022-01-31 MED ORDER — AZELASTINE HCL 0.1 % NA SOLN: 2.0000 | Freq: Two times a day (BID) | NASAL | 12 refills | Status: DC

## 2022-01-31 MED ORDER — FLUTICASONE PROPIONATE 50 MCG/ACT NA SUSP: 2.0000 | Freq: Every day | NASAL | 2 refills | Status: AC

## 2022-01-31 MED ORDER — LEVOCETIRIZINE DIHYDROCHLORIDE 5 MG PO TABS: 5.0000 mg | ORAL_TABLET | Freq: Every evening | ORAL | 1 refills | Status: AC

## 2022-01-31 MED ORDER — EPINEPHRINE 0.3 MG/0.3ML IJ SOAJ: 0.3000 mg | INTRAMUSCULAR | 1 refills | Status: AC | PRN

## 2022-01-31 MED ORDER — MONTELUKAST SODIUM 10 MG PO TABS: 10.0000 mg | ORAL_TABLET | Freq: Every day | ORAL | 1 refills | Status: DC

## 2022-01-31 NOTE — Progress Notes (Signed)
New Patient Note  RE: Barry Campbell MRN: [id number] DOB: 1955-09-19 Date of Office Visit: 01/31/2022  Consult requested by: Lennie Muckle, FNP Primary care provider: Cyndi Bender, PA-C  Chief Complaint: Allergies (Nasal congestion )  History of Present Illness: I had the pleasure of seeing Barry Campbell for initial evaluation at the [facility] on 01/31/2022. He is a 66 y.o. male, who is referred here by Cyndi Bender, PA-C for the evaluation of allergic rhinitis and adverse food reaction.  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  Stopped in 2009 reports restarting me in 2012 through 2017.  Reports completing about 10 years but was administering them at home.  Reports mild fatigue with some injections, denies SR, LLR.   Concern for Food Allergy:  Food of concern: tuna, sardines, History of reaction: nausea, vomiting , dose dependent  Previous allergy testing no Eats egg, dairy, wheat, soy, fish, shellfish, peanuts, tree nuts, sesame without reactions Carries an epinephrine autoinjector: yes Has food allergy action plan no     Assessment and Plan: Barry Campbell is a 67 y.o. male with: Seasonal and perennial allergic rhinitis - Plan: Allergy Test, Interdermal Allergy Test  Seasonal allergic conjunctivitis - Plan: Allergy Test, Interdermal Allergy Test  Adverse food reaction, initial encounter - Plan: Allergy Test Plan: Patient Instructions  Allergic Rhinitis: not  controlled  -You would  be a great candidate for allergy injections however understand the logistics for this.  We can try changing medications around first and see if that better controls her symptoms if not allergy injections may be an option - Testing today showed skin prick positive to grass, weeds, trees, roach - Intradermal testing showed positive to Johnson grass, Ragweed, Mold and Cat  - Copy of test results provided.  - Avoidance measures provided. - Continue taking:  Allegra 180mg  daily  - You can use an extra dose of the antihistamine, if needed, for breakthrough symptoms.  - Consider nasal saline rinses 1-2 times daily to remove allergens from the nasal cavities as well as help with mucous clearance (this is especially helpful to do before the nasal sprays are given)  Start allergy injections. Had a detailed discussion with patient/family that clinical history is suggestive of allergic rhinitis, and may benefit from allergy immunotherapy (AIT). Discussed in detail regarding the dosing, schedule, side effects (mild to moderate local allergic reaction and rarely systemic allergic reactions including anaphylaxis/death), alternatives and benefits (significant improvement in nasal symptoms, seasonal flares of asthma) of immunotherapy with the patient. There is significant time commitment involved with allergy shots, which includes weekly immunotherapy injections for first 9-12 months and then biweekly to monthly injections for 3-5 years. Clinical response is often delayed and patient may not see an improvement for 6-12 months. Consent was signed. I have prescribed epinephrine injectable and demonstrated proper use. For mild symptoms you can take over the counter antihistamines such as Benadryl and monitor symptoms closely. If symptoms worsen or if you have severe symptoms including breathing issues, throat closure, significant swelling, whole body hives,  severe diarrhea and vomiting, lightheadedness then inject  epinephrine and seek immediate medical care afterwards. Action plan given.  DAY BEFORE IMMUNOTHERAPY: Take one tablet of Zyrtec or Claritin 10 mg or Allegra 180 mg (over the counter) Pepcid 20 mg in the morning and evening (over the counter)  Singulair 10 mg in the morning (5mg  if 6-14, 4mg  if <6) Prednisone 40 mg (2 pills) in the morning    DAY OF IMMUNOTHERAPY: Take one tablet of Zyrtec or Claritin 10 mg or Allegra 180 mg (over the counter) Pepcid 20 mg in the morning and evening (over the counter)  Singulair 10 mg in the morning  (5mg  if 6-14, 4mg  if <6yo) Prednisone 40 mg (2 pills) in the morning    Bring your Epipen with you to the clinic! Please arrive no later than 8:00am for your rush appointment Make sure to bring food, and activities to keep you occupied for the day  Adverse food reaction -Skin testing negative to shellfish, finned fish, mollusks today -Based on history and testing I suspect this is a food intolerance and not an IgE caused food allergy -Recommend avoidance as tolerated, but do not think you need an EpiPen at this time for this allergy  Follow up: for rush protocol, we will call you to schedule   Thank you so much for letting me partake in your care today.  Don't hesitate to reach out if you have any additional concerns!  Roney Marion, MD  [facility] [place] [place]   No follow-ups on file.  Meds ordered this encounter  Medications   azelastine (ASTELIN) 0.1 % nasal spray    Sig: Place 2 sprays into both nostrils 2 (two) times daily. Use in each nostril as directed    Dispense:  30 mL    Refill:  12   fluticasone (FLONASE) 50 MCG/ACT nasal spray    Sig: Place 2 sprays into both nostrils daily.    Dispense:  16 g    Refill:  2   levocetirizine (XYZAL) 5 MG tablet    Sig: Take 1 tablet (5 mg total) by mouth every evening.    Dispense:  90 tablet    Refill:  1   montelukast (SINGULAIR) 10 MG tablet    Sig: Take 1 tablet (10 mg  total) by mouth at bedtime.    Dispense:  90 tablet    Refill:  1   Lab Orders  No laboratory test(s) ordered today    Other allergy screening: Asthma: no Rhino conjunctivitis: yes Food allergy: no Medication allergy: no Hymenoptera allergy: no Urticaria:  as a teenager - no recurrence as an adult  Eczema:no History of recurrent infections suggestive of immunodeficency: no  Diagnostics: Skin Testing: Environmental allergy panel and select foods. Skin prick positive to grass, weeds, trees, roach negative to all seafood Intradermal testing showed Results interpreted by myself and discussed with patient/family.  Airborne Adult Perc - 01/31/22 0936     Time Antigen Placed [id number]    Allergen Manufacturer Lavella Hammock    Location Back    Number of Test 59    1. Control-Buffer 50% Glycerol Negative    2. Control-Histamine 1 mg/ml 2+    3. Albumin saline 2+    4. Taylor 3+    5. [place] 3+    6. Johnson Negative    7. [place] Blue 3+    8. Meadow Fescue Negative    9. Perennial Rye 3+    10. Sweet Vernal  3+    11. Timothy 3+    12. Cocklebur Negative    13. Burweed Marshelder 3+    14. Ragweed, short Negative    15. Ragweed, Giant Negative    16. Plantain,  English 3+    17. Lamb's Quarters 3+    18. Sheep Sorrell 3+    19. Rough Pigweed Negative    20. Marsh Elder, Rough 3+    21. Mugwort, Common Negative    22. Ash mix Negative    23. Birch mix Negative    24. Beech American Negative    25. Box, Elder Negative    26. Cedar, red Negative    27. Cottonwood, Eastern 3+    28. Elm mix 3+    29. Hickory 3+    30. Maple mix 3+    31. Oak, [place] mix 3+    32. Pecan Pollen Negative    33. Pine mix 3+    34. Sycamore Eastern 3+    35. Walnut, Black Pollen 3+    36. Alternaria alternata Negative    37. Cladosporium Herbarum Negative    38. Aspergillus mix Negative    39. Penicillium mix Negative    40. Bipolaris sorokiniana (Helminthosporium) Negative    41. Drechslera  spicifera (Curvularia) Negative    42. Mucor plumbeus Negative    43. Fusarium moniliforme Negative    44. Aureobasidium pullulans (pullulara) Negative    45. Rhizopus oryzae Negative    46. Botrytis cinera Negative    47. Epicoccum nigrum Negative    48. Phoma betae Negative    49. Candida Albicans Negative    50. Trichophyton mentagrophytes Negative    51. Mite, D Farinae  5,000 AU/ml Negative    52. Mite, D Pteronyssinus  5,000 AU/ml Negative    53. Cat Hair 10,000 BAU/ml Negative    54.  Dog Epithelia Negative    55. Mixed Feathers Negative    56. Horse Epithelia Negative    57. Cockroach, German 3+    58. Mouse Negative    59. Tobacco Leaf Negative             Intradermal - 01/31/22 1011     Time Antigen Placed 1011    Allergen Manufacturer Waynette Buttery    Location Back    Number of Test 10    Control Negative    Johnson 4+    Ragweed mix 4+    Mold 1 3+    Mold 2 3+    Mold 3 3+    Mold 4 3+    Cat Negative    Dog 3+    Mite mix Negative             Food Adult Perc - 01/31/22 0900     Time Antigen Placed 8250    Allergen Manufacturer Waynette Buttery    Location Back    Number of allergen test 12    18. Catfish Negative    19. Bass Negative    20. Trout Negative    21. Tuna Negative    22. Salmon Negative    23. Flounder Negative    24. Codfish Negative    25. Shrimp Negative    26. Crab Negative    27. Lobster Negative    28. Oyster Negative    29. Scallops Negative             Past Medical History: Patient Active Problem List   Diagnosis Date Noted  Seasonal allergic conjunctivitis 01/31/2022   Adverse food reaction 01/31/2022   Seasonal and perennial allergic rhinitis 04/27/2008   Past Medical History:  Diagnosis Date   Allergic rhinitis    Past Surgical History: Past Surgical History:  Procedure Laterality Date   NASAL SINUS SURGERY  2001   Medication List:  Current Outpatient Medications  Medication Sig Dispense Refill   azelastine  (ASTELIN) 0.1 % nasal spray Place 2 sprays into both nostrils 2 (two) times daily. Use in each nostril as directed 30 mL 12   fluticasone (FLONASE) 50 MCG/ACT nasal spray Place 2 sprays into both nostrils daily. 16 g 2   levocetirizine (XYZAL) 5 MG tablet Take 1 tablet (5 mg total) by mouth every evening. 90 tablet 1   montelukast (SINGULAIR) 10 MG tablet Take 1 tablet (10 mg total) by mouth at bedtime. 90 tablet 1   pseudoephedrine-guaifenesin (MUCINEX D) 60-600 MG 12 hr tablet Take 1 tablet by mouth every 12 (twelve) hours.     No current facility-administered medications for this visit.   Allergies: No Known Allergies Social History: Social History   Socioeconomic History   Marital status: Married    Spouse name: Not on file   Number of children: 2   Years of education: Not on file   Highest education level: Not on file  Occupational History   Occupation: [employer]  Tobacco Use   Smoking status: Former    Packs/day: 1.00    Years: 4.00    Pack years: 4.00    Types: Cigarettes    Quit date: 09/03/1973    Years since quitting: 48.4    Passive exposure: Never   Smokeless tobacco: Not on file  Vaping Use   Vaping Use: Never used  Substance and Sexual Activity   Alcohol use: Never   Drug use: Never   Sexual activity: Not on file  Other Topics Concern   Not on file  Social History Narrative   Not on file   Social Determinants of Health   Financial Resource Strain: Not on file  Food Insecurity: Not on file  Transportation Needs: Not on file  Physical Activity: Not on file  Stress: Not on file  Social Connections: Not on file   Lives in a single-family home that is 67 years old.  There are no roaches in the house and bed is 2 feet off the floor.  He does not use dust mite precautions on better pillows.  He is exposed to fumes, chemicals and dust through his work. Smoking: 4-pack-year history quit in 1975 Occupation: Works as a Primary school teacher  HistoryFreight forwarder in the house: yes Carpet in the family room: yes Carpet in the bedroom: yes Heating: gas Cooling: window Pet: yes cows at farm  Family History: Family History  Problem Relation Age of Onset   Allergic rhinitis Mother    Emphysema Paternal Uncle    Asthma Neg Hx    Immunodeficiency Neg Hx    Urticaria Neg Hx    Eczema Neg Hx    Atopy Neg Hx    Angioedema Neg Hx      ROS: All others negative except as noted per HPI.   Objective: BP 126/82   Pulse 74   Temp 98 F (36.7 C) (Temporal)   Resp 20   Ht 5\' 11"  (1.803 m)   Wt 215 lb 3.2 oz (97.6 kg)   SpO2 98%   BMI 30.01 kg/m  Body mass index is  30.01 kg/m.  General Appearance:  Alert, cooperative, no distress, appears stated age  Head:  Normocephalic, without obvious abnormality, atraumatic  Eyes:  Conjunctiva clear, EOM's intact  Nose: Nares normal, normal mucosa, no visible anterior polyps, and septum midline  Throat: Lips, tongue normal; teeth and gums normal, normal posterior oropharynx and no tonsillar exudate  Neck: Supple, symmetrical  Lungs:   clear to auscultation bilaterally, Respirations unlabored, no coughing  Heart:  regular rate and rhythm and no murmur, Appears well perfused  Extremities: No edema  Skin: Skin color, texture, turgor normal, no rashes or lesions on visualized portions of skin  Neurologic: No gross deficits   The plan was reviewed with the patient/family, and all questions/concerned were addressed.  It was my pleasure to see Barry Campbell today and participate in his care. Please feel free to contact me with any questions or concerns.  Sincerely,  Roney Marion, MD Allergy & Immunology  [facility] office: [phone] [facility] office: [phone]

## 2022-01-31 NOTE — Patient Instructions (Addendum)
Allergic Rhinitis: not  controlled  -You would be a great candidate for allergy injections however understand the logistics for this.  We can try changing medications around first and see if that better controls her symptoms if not allergy injections may be an option - Testing today showed skin prick positive to grass, weeds, trees, roach - Intradermal testing showed positive to Johnson grass, Ragweed, Mold and Cat  - Copy of test results provided.  - Avoidance measures provided. - Continue taking:  Allegra 180mg  daily  - You can use an extra dose of the antihistamine, if needed, for breakthrough symptoms.  - Consider nasal saline rinses 1-2 times daily to remove allergens from the nasal cavities as well as help with mucous clearance (this is especially helpful to do before the nasal sprays are given)  Start allergy injections. Had a detailed discussion with patient/family that clinical history is suggestive of allergic rhinitis, and may benefit from allergy immunotherapy (AIT). Discussed in detail regarding the dosing, schedule, side effects (mild to moderate local allergic reaction and rarely systemic allergic reactions including anaphylaxis/death), alternatives and benefits (significant improvement in nasal symptoms, seasonal flares of asthma) of immunotherapy with the patient. There is significant time commitment involved with allergy shots, which includes weekly immunotherapy injections for first 9-12 months and then biweekly to monthly injections for 3-5 years. Clinical response is often delayed and patient may not see an improvement for 6-12 months. Consent was signed. I have prescribed epinephrine injectable and demonstrated proper use. For mild symptoms you can take over the counter antihistamines such as Benadryl and monitor symptoms closely. If symptoms worsen or if you have severe symptoms including breathing issues, throat closure, significant swelling, whole body hives, severe diarrhea and  vomiting, lightheadedness then inject epinephrine and seek immediate medical care afterwards. Action plan given.  DAY BEFORE IMMUNOTHERAPY: Take one tablet of Zyrtec or Claritin 10 mg or Allegra 180 mg (over the counter) Pepcid 20 mg in the morning and evening (over the counter)  Singulair 10 mg in the morning (5mg  if 6-14, 4mg  if <6) Prednisone 40 mg (2 pills) in the morning    DAY OF IMMUNOTHERAPY: Take one tablet of Zyrtec or Claritin 10 mg or Allegra 180 mg (over the counter) Pepcid 20 mg in the morning and evening (over the counter)  Singulair 10 mg in the morning  (5mg  if 6-14, 4mg  if <6yo) Prednisone 40 mg (2 pills) in the morning    Bring your Epipen with you to the clinic! Please arrive no later than 8:00am for your rush appointment Make sure to bring food, and activities to keep you occupied for the day  Adverse food reaction -Skin testing negative to shellfish, finned fish, mollusks today -Based on history and testing I suspect this is a food intolerance and not an IgE caused food allergy -Recommend avoidance as tolerated, but do not think you need an EpiPen at this time for this allergy  Follow up: for rush protocol, we will call you to schedule   Thank you so much for letting me partake in your care today.  Don't hesitate to reach out if you have any additional concerns!  , MD  [facility] [place] [place]

## 2022-02-08 ENCOUNTER — Telehealth: Payer: Self-pay | Admitting: *Deleted

## 2022-02-08 NOTE — Telephone Encounter (Signed)
Pt said he called Medicare and BCBS and could not get clear answers about RUSH/shot coverage and would like some help. Victorino Dike, do you think you could help with this?

## 2022-02-09 ENCOUNTER — Other Ambulatory Visit: Payer: Self-pay | Admitting: Internal Medicine

## 2022-02-09 DIAGNOSIS — H1010 Acute atopic conjunctivitis, unspecified eye: Secondary | ICD-10-CM

## 2022-02-09 DIAGNOSIS — J302 Other seasonal allergic rhinitis: Secondary | ICD-10-CM

## 2022-02-19 NOTE — Progress Notes (Signed)
Aeroallergen Immunotherapy   Ordering Provider: Dr. Ferol Luz   Patient Details  Name: Barry Campbell  MRN: [id number]  Date of Birth: [birth year]   Order 2 of 2   Vial Label: M   0.2 ml (Volume)  1:20 Concentration -- Alternaria alternata  0.2 ml (Volume)  1:20 Concentration -- Cladosporium herbarum  0.2 ml (Volume)  1:10 Concentration -- Aspergillus mix  0.2 ml (Volume)  1:10 Concentration -- Penicillium mix  0.2 ml (Volume)  1:20 Concentration -- Bipolaris sorokiniana  0.2 ml (Volume)  1:20 Concentration -- Drechslera spicifera  0.2 ml (Volume)  1:10 Concentration -- Mucor plumbeus  0.2 ml (Volume)  1:10 Concentration -- Fusarium moniliforme  0.2 ml (Volume)  1:40 Concentration -- Aureobasidium pullulans  0.2 ml (Volume)  1:10 Concentration -- Rhizopus oryzae    2.0  ml Extract Subtotal  3.0  ml Diluent  5.0  ml Maintenance Total   Schedule:  RUSH and then schedule B  Silver Vial (1:1,000,000): RUSH  Blue Vial (1:100,000): RUSH  Yellow Vial (1:10,000): RUSH  Green Vial (1:1,000): Schedule B (6 doses)  Red Vial (1:100): Schedule A (10 doses)   Special Instructions: RUSH then schedule B

## 2022-02-19 NOTE — Progress Notes (Signed)
VIALS NOT MADE UNTIL APPT SCHED. 

## 2022-02-19 NOTE — Progress Notes (Signed)
Aeroallergen Immunotherapy   Ordering Provider: Dr. Ferol Luz   Patient Details  Name: Barry Campbell  MRN: [id number]  Date of Birth: 1955/11/24   Order 1 of 2   Vial Label: T-G-W-D   0.3 ml (Volume)  BAU Concentration -- 7 Grass Mix* 100,000 ([address] [place], [place], [place], [place] Rye, RedTop, Sweet Vernal, Timothy)  0.2 ml (Volume)  1:20 Concentration -- [place]  0.3 ml (Volume)  BAU Concentration -- [place] 10,000  0.2 ml (Volume)  1:20 Concentration -- Johnson  0.3 ml (Volume)  1:20 Concentration -- Ragweed Mix  0.2 ml (Volume)  1:20 Concentration -- Burweed Marshelder  0.2 ml (Volume)  1:10 Concentration -- Plantain English  0.5 ml (Volume)  1:20 Concentration -- Weed Mix*  0.5 ml (Volume)  1:20 Concentration -- Eastern 10 Tree Mix (also Sweet Gum)  0.2 ml (Volume)  1:20 Concentration -- Walnut, Black Pollen  0.5 ml (Volume)  1:10 Concentration -- Dog Epithelia    3.3  ml Extract Subtotal  1.7  ml Diluent  5.0  ml Maintenance Total   Schedule:  RUSH and then schedule B  Silver Vial (1:1,000,000): Rush  [employer] (1:100,000): Rush  Yellow Vial (1:10,000): Rush  Green Vial (1:1,000): Schedule B (6 doses)  Red Vial (1:100): Schedule A (10 doses)   Special Instructions: [name] then schedule B

## 2022-07-28 ENCOUNTER — Other Ambulatory Visit: Payer: Self-pay | Admitting: Internal Medicine

## 2022-11-13 ENCOUNTER — Encounter: Payer: Self-pay | Admitting: Internal Medicine

## 2022-11-13 ENCOUNTER — Ambulatory Visit: Payer: Medicare Other | Admitting: Internal Medicine

## 2022-11-13 VITALS — BP 116/80 | HR 73 | Temp 97.9°F | Resp 17 | Wt 214.8 lb

## 2022-11-13 DIAGNOSIS — H1013 Acute atopic conjunctivitis, bilateral: Secondary | ICD-10-CM | POA: Diagnosis not present

## 2022-11-13 DIAGNOSIS — J3089 Other allergic rhinitis: Secondary | ICD-10-CM | POA: Diagnosis not present

## 2022-11-13 DIAGNOSIS — J302 Other seasonal allergic rhinitis: Secondary | ICD-10-CM

## 2022-11-13 DIAGNOSIS — T781XXD Other adverse food reactions, not elsewhere classified, subsequent encounter: Secondary | ICD-10-CM

## 2022-11-13 DIAGNOSIS — H1010 Acute atopic conjunctivitis, unspecified eye: Secondary | ICD-10-CM

## 2022-11-13 NOTE — Patient Instructions (Addendum)
Allergic Rhinitis: not  controlled  - Continue avoidance measures to grass, weeds, trees, roach, mold, cat - Continue taking:  xyzal '5mg'$  daily, astelin 1 -2 spray per nostril per day, montelukast '10mg'$  per daily  - You can use an extra dose of the antihistamine, if needed, for breakthrough symptoms.  - Consider nasal saline rinses 1-2 times daily to remove allergens from the nasal cavities as well as help with mucous clearance (this is especially helpful to do before the nasal sprays are given)  Start allergy injections. We will get you scheduled for RUSH protocol  Had a detailed discussion with patient/family that clinical history is suggestive of allergic rhinitis, and may benefit from allergy immunotherapy (AIT). Discussed in detail regarding the dosing, schedule, side effects (mild to moderate local allergic reaction and rarely systemic allergic reactions including anaphylaxis/death), alternatives and benefits (significant improvement in nasal symptoms, seasonal flares of asthma) of immunotherapy with the patient. There is significant time commitment involved with allergy shots, which includes weekly immunotherapy injections for first 9-12 months and then biweekly to monthly injections for 3-5 years. Clinical response is often delayed and patient may not see an improvement for 6-12 months. Consent was signed. I have prescribed epinephrine injectable and demonstrated proper use. For mild symptoms you can take over the counter antihistamines such as Benadryl and monitor symptoms closely. If symptoms worsen or if you have severe symptoms including breathing issues, throat closure, significant swelling, whole body hives, severe diarrhea and vomiting, lightheadedness then inject epinephrine and seek immediate medical care afterwards. Action plan given.  Adverse food reaction - Skin testing negative to shellfish, finned fish, mollusks today - This is a food intolerance, it is okay to try reintroducing this  at home   Follow up: for rush protocol,  Follow up: in clinic in 12 months   Thank you so much for letting me partake in your care today.  Don't hesitate to reach out if you have any additional concerns!  Roney Marion, MD  [facility] [place], [place]

## 2022-11-13 NOTE — Progress Notes (Signed)
Follow Up Note  RE: Barry Campbell MRN: [id number] DOB: April 30, 1956 Date of Office Visit: 11/13/2022  Referring provider: Cyndi Bender, PA-C Primary care provider: Cyndi Bender, PA-C  Chief Complaint: Follow-up and Allergic Rhinitis   History of Present Illness: I had the pleasure of seeing Barry Campbell for a follow up visit at the [facility] on 11/13/2022. He is a 67 y.o. male, who is being followed for allergic rhinitis, food intolerance. His previous allergy office visit was on 01/13/22 with Dr. Edison Pace. Today is a regular follow up visit.  History obtained from patient, chart review.  At last visit we started Astelin, Xyzal, montelukast and plan to start allergy injections via rash.  Due to logistics he failed to schedule rush appointment.  Today he reports that over the winter her symptoms are well-controlled on the above medications, however as the spring started he has had increased sneezing, nasal congestion, rhinorrhea, red itchy eyes which are interfering significantly his quality of life.  He would like to proceed with allergy injections and start with Roslyn Smiling buildup  He continues to avoid large amounts of fish but can tolerate all forms of finned fish and small amounts.  If he eats too large amount he develops abdominal discomfort and nausea.  Pertinent History/Diagnostics:   - Allergic Rhinitis:   - SPT environmental panel (01/31/2022): grass, weeds, trees, roach, mold, cat  -AIT through pulmonary.  Stopped in 2009 reports restarting me in 2012 through 2017.  Complete about 10 years but with home administration. Reports mild fatigue with some injections, denies SR, LLR.   Pertinent History/Diagnostics:  - Food Allergy (tuna, sardines)  - Hx of reaction: negative to shellfish, finned fish, mollusks   - SPT select foods (01/31/22): negative to shellfish, finned fish, mollusks       Assessment and Plan: Arhum is a 67 y.o. male with: Seasonal and  perennial allergic rhinitis  Seasonal allergic conjunctivitis  Adverse food reaction, subsequent encounter   Plan: Patient Instructions  Allergic Rhinitis: not  controlled  - Continue avoidance measures to grass, weeds, trees, roach, mold, cat - Continue taking:  xyzal '5mg'$  daily, astelin 1 -2 spray per nostril per day, montelukast '10mg'$  per daily  - You can use an extra dose of the antihistamine, if needed, for breakthrough symptoms.  - Consider nasal saline rinses 1-2 times daily to remove allergens from the nasal cavities as well as help with mucous clearance (this is especially helpful to do before the nasal sprays are given)  Start allergy injections. We will get you scheduled for RUSH protocol  Had a detailed discussion with patient/family that clinical history is suggestive of allergic rhinitis, and may benefit from allergy immunotherapy (AIT). Discussed in detail regarding the dosing, schedule, side effects (mild to moderate local allergic reaction and rarely systemic allergic reactions including anaphylaxis/death), alternatives and benefits (significant improvement in nasal symptoms, seasonal flares of asthma) of immunotherapy with the patient. There is significant time commitment involved with allergy shots, which includes weekly immunotherapy injections for first 9-12 months and then biweekly to monthly injections for 3-5 years. Clinical response is often delayed and patient may not see an improvement for 6-12 months. Consent was signed. I have prescribed epinephrine injectable and demonstrated proper use. For mild symptoms you can take over the counter antihistamines such as Benadryl and monitor symptoms closely. If symptoms worsen or if you have severe symptoms including breathing issues, throat closure, significant swelling, whole body hives, severe diarrhea and vomiting, lightheadedness  then inject epinephrine and seek immediate medical care afterwards. Action plan given.  Adverse  food reaction - Skin testing negative to shellfish, finned fish, mollusks today - This is a food intolerance, it is okay to try reintroducing this at home   Follow up: for rush protocol,  Follow up: in clinic in 12 months   Thank you so much for letting me partake in your care today.  Don't hesitate to reach out if you have any additional concerns!  Roney Marion, MD  [facility] [place] [place]    No orders of the defined types were placed in this encounter.   Lab Orders  No laboratory test(s) ordered today   Diagnostics: None done  Medication List:  Current Outpatient Medications  Medication Sig Dispense Refill   azelastine (ASTELIN) 0.1 % nasal spray Place 2 sprays into both nostrils 2 (two) times daily. Use in each nostril as directed 30 mL 12   EPINEPHrine 0.3 mg/0.3 mL IJ SOAJ injection Inject 0.3 mg into the muscle as needed for anaphylaxis. 1 each 1   fluticasone (FLONASE) 50 MCG/ACT nasal spray Place 2 sprays into both nostrils daily. 16 g 2   levocetirizine (XYZAL) 5 MG tablet Take 1 tablet (5 mg total) by mouth every evening. 90 tablet 1   montelukast (SINGULAIR) 10 MG tablet TAKE 1 TABLET BY MOUTH EVERYDAY AT BEDTIME 90 tablet 1   pseudoephedrine-guaifenesin (MUCINEX D) 60-600 MG 12 hr tablet Take 1 tablet by mouth every 12 (twelve) hours.     No current facility-administered medications for this visit.   Allergies: No Known Allergies I reviewed his past medical history, social history, family history, and environmental history and no significant changes have been reported from his previous visit.  ROS: All others negative except as noted per HPI.   Objective: BP 116/80   Pulse 73   Temp 97.9 F (36.6 C) (Temporal)   Resp 17   Wt 214 lb 12.8 oz (97.4 kg)   SpO2 97%   BMI 29.96 kg/m  Body mass index is 29.96 kg/m. General Appearance:  Alert, cooperative, no distress, appears stated age  Head:  Normocephalic, without obvious  abnormality, atraumatic  Eyes:  Conjunctiva clear, EOM's intact  Nose: Nares normal,   Throat: Lips, tongue normal; teeth and gums normal,   Neck: Supple, symmetrical  Lungs:   , Respirations unlabored, no coughing  Heart:  , Appears well perfused  Extremities: No edema  Skin: Skin color, texture, turgor normal, no rashes or lesions on visualized portions of skin  Neurologic: No gross deficits   Previous notes and tests were reviewed. The plan was reviewed with the patient/family, and all questions/concerned were addressed.  It was my pleasure to see Cl[place] today and participate in his care. Please feel free to contact me with any questions or concerns.  Sincerely,  Roney Marion, MD  Allergy & Immunology  [facility] Office: [phone]

## 2022-11-15 DIAGNOSIS — J302 Other seasonal allergic rhinitis: Secondary | ICD-10-CM | POA: Diagnosis not present

## 2022-11-15 NOTE — Progress Notes (Signed)
VIALS EXP 11-15-23

## 2022-11-16 DIAGNOSIS — J3081 Allergic rhinitis due to animal (cat) (dog) hair and dander: Secondary | ICD-10-CM | POA: Diagnosis not present

## 2022-11-26 ENCOUNTER — Other Ambulatory Visit: Payer: Self-pay

## 2022-11-26 MED ORDER — FAMOTIDINE 20 MG PO TABS: ORAL_TABLET | ORAL | 0 refills | Status: AC

## 2022-11-26 MED ORDER — PREDNISONE 20 MG PO TABS: ORAL_TABLET | ORAL | 0 refills | Status: DC

## 2022-11-26 NOTE — Telephone Encounter (Signed)
Spoke to pt that pre-med's was sent into pharmacy. He is doing well and advise to call office 72 hrs ahead of appt if need to reschedule.

## 2022-12-04 ENCOUNTER — Ambulatory Visit: Payer: Medicare Other | Admitting: Internal Medicine

## 2022-12-04 VITALS — BP 138/80 | HR 80 | Temp 97.9°F | Resp 17

## 2022-12-04 DIAGNOSIS — J309 Allergic rhinitis, unspecified: Secondary | ICD-10-CM

## 2022-12-04 NOTE — Progress Notes (Unsigned)
   RAPID DESENSITIZATION Note  RE: Barry Campbell MRN: [id number] DOB: [birth year] Date of Office Visit: 12/04/2022  Subjective:  Patient presents today for rapid desensitization.  Interval History: Patient has not been ill, he has taken all premedications as per protocol.  Recent/Current History: Pulmonary disease: no Cardiac disease: no Respiratory infection: no Rash: no Itch: no Swelling: no Cough: no Shortness of breath: no Runny/stuffy nose: no Itchy eyes: no Beta-blocker use: no  Patient/guardian was informed of the procedure with verbalized understanding of the risk of anaphylaxis. Consent has been signed.   Medication List:  Current Outpatient Medications  Medication Sig Dispense Refill   famotidine (PEPCID) 20 MG tablet Take 1 tab Monday morning and 1 evening, than take 1 tab Tuesday morning before RUSH appt. 4 tablet 0   predniSONE (DELTASONE) 20 MG tablet Take 2 tabs Monday morning and 2 tabs Tuesday morning before RUSH appt. 4 tablet 0   azelastine (ASTELIN) 0.1 % nasal spray Place 2 sprays into both nostrils 2 (two) times daily. Use in each nostril as directed 30 mL 12   EPINEPHrine 0.3 mg/0.3 mL IJ SOAJ injection Inject 0.3 mg into the muscle as needed for anaphylaxis. 1 each 1   fluticasone (FLONASE) 50 MCG/ACT nasal spray Place 2 sprays into both nostrils daily. 16 g 2   levocetirizine (XYZAL) 5 MG tablet Take 1 tablet (5 mg total) by mouth every evening. 90 tablet 1   montelukast (SINGULAIR) 10 MG tablet TAKE 1 TABLET BY MOUTH EVERYDAY AT BEDTIME 90 tablet 1   pseudoephedrine-guaifenesin (MUCINEX D) 60-600 MG 12 hr tablet Take 1 tablet by mouth every 12 (twelve) hours.     No current facility-administered medications for this visit.   Allergies: No Known Allergies I reviewed his past medical history, social history, family history, and environmental history and no significant changes have been reported from his previous visit.  ROS: Negative except as per  HPI.  Objective: There were no vitals taken for this visit. There is no height or weight on file to calculate BMI.   General Appearance:  Alert, cooperative, no distress, appears stated age  Head:  Normocephalic, without obvious abnormality, atraumatic  Eyes:  Conjunctiva clear, EOM's intact  Nose: Nares normal  Throat: Lips, tongue normal; teeth and gums normal, normal posterior oropharnyx  Neck: Supple, symmetrical  Lungs:   CTAB, Respirations unlabored, no coughing  Heart:  Appears well perfused  Extremities: No edema  Skin: Skin color, texture, turgor normal, no rashes or lesions on visualized portions of skin  Neurologic: No gross deficits     Diagnostics:   PROCEDURES:  Patient received the following doses every hour: Step 1:  0.89ml - 1:1,000,000 dilution (silver vial) Step 2:  0.92ml - 1:1,000,000 dilution (silver vial) Step 3: 0.78ml - 1:100,000 dilution (blue vial)  Step 4: 0.44ml - 1:100,000 dilution (blue vial)  Step 5: 0.60ml - 1:10,000 dilution (gold vial) Step 6: 0.27ml - 1:10,000 dilution (gold vial) Step 7: 0.18ml - 1:10,000 dilution (gold vial) Step 8: 0.73ml - 1:10,000 dilution (gold vial)  Patient was observed for 1 hour after the last dose.   Procedure started at 8:20 Procedure ended at 2:55   ASSESSMENT/PLAN:   Patient has tolerated the rapid desensitization protocol.  Next appointment: Start at 0.44ml of 1:1000 dilution (green vial) and build up per protocol.

## 2022-12-11 ENCOUNTER — Ambulatory Visit (INDEPENDENT_AMBULATORY_CARE_PROVIDER_SITE_OTHER): Payer: Medicare Other

## 2022-12-11 DIAGNOSIS — J309 Allergic rhinitis, unspecified: Secondary | ICD-10-CM

## 2022-12-18 ENCOUNTER — Ambulatory Visit (INDEPENDENT_AMBULATORY_CARE_PROVIDER_SITE_OTHER): Payer: Medicare Other | Admitting: *Deleted

## 2022-12-18 DIAGNOSIS — J309 Allergic rhinitis, unspecified: Secondary | ICD-10-CM

## 2022-12-18 NOTE — Progress Notes (Signed)
Immunotherapy   Patient Details  Name: Barry Campbell MRN: [id number] Date of Birth: 1955/09/21  12/18/2022  Barry Campbell here to pick up  Green vials out of office to take to Barry November, MD [address] [place], [place] [address]  T#:[phone] F#: [phone] Following schedule: B  Frequency:1 time per week Epi-Pen:Epi-Pen Available  Signed paperwork received by outside provider.  1:1000 Green (Mold) (Tree-Grass-Weed-Dog) Expire 11/15/23   Barry Campbell 12/18/2022, 11:19 AM

## 2023-01-22 ENCOUNTER — Ambulatory Visit: Payer: Medicare Other

## 2023-01-24 ENCOUNTER — Ambulatory Visit (INDEPENDENT_AMBULATORY_CARE_PROVIDER_SITE_OTHER): Payer: Medicare HMO

## 2023-01-24 DIAGNOSIS — J309 Allergic rhinitis, unspecified: Secondary | ICD-10-CM

## 2023-01-24 NOTE — Progress Notes (Signed)
Immunotherapy   Patient Details  Name: Barry Campbell MRN: [id number] Date of Birth: May 20, 1956  01/24/2023  Barry Campbell here to pick up #1Red 1:100 (MOLDS and T-G-W-D) Following schedule: A  Frequency:1 time per week Epi-Pen:Epi-Pen Available  Consent signed and patient instructions given. [facility]   Barry Campbell J Barry Campbell 01/24/2023, 1:50 PM

## 2023-01-27 ENCOUNTER — Other Ambulatory Visit: Payer: Self-pay | Admitting: Internal Medicine

## 2023-02-22 ENCOUNTER — Other Ambulatory Visit: Payer: Self-pay | Admitting: Internal Medicine

## 2023-05-14 NOTE — Progress Notes (Signed)
Vials exp 05-13-24

## 2023-05-15 DIAGNOSIS — J302 Other seasonal allergic rhinitis: Secondary | ICD-10-CM | POA: Diagnosis not present

## 2023-05-16 DIAGNOSIS — J3081 Allergic rhinitis due to animal (cat) (dog) hair and dander: Secondary | ICD-10-CM | POA: Diagnosis not present

## 2023-05-21 ENCOUNTER — Ambulatory Visit (INDEPENDENT_AMBULATORY_CARE_PROVIDER_SITE_OTHER): Payer: Medicare HMO

## 2023-05-21 DIAGNOSIS — J309 Allergic rhinitis, unspecified: Secondary | ICD-10-CM | POA: Diagnosis not present

## 2023-05-21 NOTE — Progress Notes (Signed)
Immunotherapy   Patient Details  Name: Barry Campbell MRN: [id number] Date of Birth: [birth year]  05/21/2023  Barry Campbell here to pick up Red 1:100 #2 (MOLDS and T-G-W-D) Following schedule: A  Frequency:1 time per week Epi-Pen:Epi-Pen Available  Consent signed and patient instructions given. [facility].   [name] J Alleyne Lac 05/21/2023, 8:45 AM

## 2023-08-14 NOTE — Progress Notes (Signed)
VIALS EXP 08-13-24

## 2023-08-15 DIAGNOSIS — J302 Other seasonal allergic rhinitis: Secondary | ICD-10-CM | POA: Diagnosis not present

## 2023-08-16 DIAGNOSIS — J3081 Allergic rhinitis due to animal (cat) (dog) hair and dander: Secondary | ICD-10-CM | POA: Diagnosis not present

## 2023-08-27 ENCOUNTER — Ambulatory Visit (INDEPENDENT_AMBULATORY_CARE_PROVIDER_SITE_OTHER): Payer: Medicare HMO

## 2023-08-27 DIAGNOSIS — J309 Allergic rhinitis, unspecified: Secondary | ICD-10-CM | POA: Diagnosis not present

## 2023-08-27 NOTE — Progress Notes (Signed)
Immunotherapy   Patient Details  Name: Barry Campbell MRN: [id number] Date of Birth: [birth year]  08/27/2023  Barry Campbell   PICKED UP RED 1/100 MOLDS & T-G-W-D @ 0.10 Following schedule: C 0.1,0.3,0.5 THEN EVERY 2 WEEKS. Frequency:1 time per week Epi-Pen:Epi-Pen Available  Consent signed and patient instructions given. Taking vials out of office to [facility] where he has been receiving them.   Jacqulyn Cane 08/27/2023, 8:44 AM

## 2023-09-02 ENCOUNTER — Ambulatory Visit: Payer: Medicare HMO

## 2024-01-08 ENCOUNTER — Encounter: Payer: Self-pay | Admitting: Internal Medicine

## 2024-01-08 ENCOUNTER — Ambulatory Visit (INDEPENDENT_AMBULATORY_CARE_PROVIDER_SITE_OTHER): Admitting: Internal Medicine

## 2024-01-08 VITALS — BP 120/72 | HR 60 | Temp 98.4°F | Resp 20 | Wt 215.0 lb

## 2024-01-08 DIAGNOSIS — J302 Other seasonal allergic rhinitis: Secondary | ICD-10-CM | POA: Diagnosis not present

## 2024-01-08 DIAGNOSIS — J3089 Other allergic rhinitis: Secondary | ICD-10-CM | POA: Diagnosis not present

## 2024-01-08 DIAGNOSIS — T781XXD Other adverse food reactions, not elsewhere classified, subsequent encounter: Secondary | ICD-10-CM

## 2024-01-08 DIAGNOSIS — H1013 Acute atopic conjunctivitis, bilateral: Secondary | ICD-10-CM | POA: Diagnosis not present

## 2024-01-08 DIAGNOSIS — H1010 Acute atopic conjunctivitis, unspecified eye: Secondary | ICD-10-CM

## 2024-01-08 MED ORDER — MONTELUKAST SODIUM 10 MG PO TABS: 10.0000 mg | ORAL_TABLET | Freq: Every day | ORAL | 1 refills | Status: DC

## 2024-01-08 MED ORDER — AZELASTINE HCL 137 MCG/SPRAY NA SOLN: 1.0000 | Freq: Two times a day (BID) | NASAL | 4 refills | Status: DC

## 2024-01-08 NOTE — Patient Instructions (Addendum)
 Allergic Rhinitis on AIT - Continue avoidance measures to grass, weeds, trees, roach, mold, cat - Take allegra (fexafenadine) 180mg  daily as needed and on shot days   - You can use an extra dose of the antihistamine, if needed, for breakthrough symptoms.  - Consider nasal saline rinses 1-2 times daily to remove allergens from the nasal cavities as well as help with mucous clearance (this is especially helpful to do before the nasal sprays are given) - Continue allergy  injections per protocol and carry epipen  on injection days   Adverse food reaction - Skin testing negative to shellfish, finned fish, mollusks today - This is a food intolerance, it is okay to try reintroducing this at home   Follow up:12 months   Thank you so much for letting me partake in your care today.  Don't hesitate to reach out if you have any additional concerns!  Orelia Binet, MD  [facility] [place] [place]

## 2024-01-08 NOTE — Progress Notes (Signed)
 Follow Up Note  RE: Barry Campbell MRN: [id number] DOB: 10-12-1955 Date of Office Visit: 01/08/2024  Referring provider: Aloha Arnold, PA-C Primary care provider: Aloha Arnold, PA-C  Chief Complaint: Medication Refill (Medication ( allergy  vials refills) refill no concerns shots are working at this time)  History of Present Illness: I had the pleasure of seeing Barry Campbell for a follow up visit at [facility] on 01/08/2024. He is a 68 y.o. male, who is being followed for allergic rhinitis, food intolerance. His previous allergy  office visit was on 12/04/22 with Dr. Jolayne Natter. Today is a regular follow up visit.  History obtained from patient, chart review.   Pertinent History/Diagnostics:   - Allergic Rhinitis:   - SPT environmental panel (01/31/2022): grass, weeds, trees, roach, mold, cat  - AIT through pulmonary.  Stopped in 2009 reports restarting me in 2012 through 2017.  Complete about 10 years but with home administration. Reports mild fatigue with some injections, denies SR, LLR.   - AIT restarted via [facility] (12/04/23): Vial 1 (mold), vial 2 (T-G-W-D)   - [facility]   Pertinent History/Diagnostics:  - Food Allergy  (tuna, sardines)  - Hx of reaction: negative to shellfish, finned fish, mollusks   - SPT select foods (01/31/22): negative to shellfish, finned fish, mollusks    Discussed the use of AI scribe software for clinical note transcription with the patient, who gave verbal consent to proceed.  History of Present Illness Barry Campbell is a 68 year old male who presents for follow-up on allergy  injections.  He receives allergy  injections every two weeks and has not experienced any adverse reactions. The injections have effectively managed his runny and stuffy nose, and he is not taking any other allergy  medications such as Xyzal , nasal sprays, or pills.  He experiences generalized itching, particularly on his back, which he  attributes to his outdoor activities. He spends all day outdoors, and the itching is not localized to the injection site. He has previously used Xyzal , which helped with itching, but he is currently not taking it or any other antihistamines regularly.  During allergy  season, he feels fatigued and attributes this to the impact of allergies on his quality of life. High pollen levels, particularly from ryegrass, exacerbate his symptoms, although the injections have been beneficial.  He is reluctant to take additional medications, referring to them as 'dope', and prefers to minimize medication use. He recalls that Benadryl made him sleepy in the past, and he is concerned about similar effects with other antihistamines.       Assessment and Plan: Barry Campbell is a 68 y.o. male with: Seasonal and perennial allergic rhinitis  Seasonal allergic conjunctivitis  Adverse food reaction, subsequent encounter   Plan: Patient Instructions  Allergic Rhinitis on AIT - Continue avoidance measures to grass, weeds, trees, roach, mold, cat - Take allegra (fexafenadine) 180mg  daily as needed and on shot days   - You can use an extra dose of the antihistamine, if needed, for breakthrough symptoms.  - Consider nasal saline rinses 1-2 times daily to remove allergens from the nasal cavities as well as help with mucous clearance (this is especially helpful to do before the nasal sprays are given) - Continue allergy  injections per protocol and carry epipen  on injection days   Adverse food reaction - Skin testing negative to shellfish, finned fish, mollusks today - This is a food intolerance, it is okay to try reintroducing this at home   Follow  up:12 months   Thank you so much for letting me partake in your care today.  Don't hesitate to reach out if you have any additional concerns!  Barry Binet, MD  [facility] [place] [place]   Meds ordered this encounter  Medications   DISCONTD:  Azelastine  HCl 137 MCG/SPRAY SOLN    Sig: Place 1 spray into the nose 2 (two) times daily.    Dispense:  30 mL    Refill:  4   DISCONTD: montelukast  (SINGULAIR ) 10 MG tablet    Sig: Take 1 tablet (10 mg total) by mouth at bedtime.    Dispense:  90 tablet    Refill:  1    Lab Orders  No laboratory test(s) ordered today   Diagnostics: None done  Medication List:  Current Outpatient Medications  Medication Sig Dispense Refill   EPINEPHrine  0.3 mg/0.3 mL IJ SOAJ injection Inject 0.3 mg into the muscle as needed for anaphylaxis. 1 each 1   famotidine  (PEPCID ) 20 MG tablet Take 1 tab Monday morning and 1 evening, than take 1 tab Tuesday morning before RUSH appt. 4 tablet 0   fluticasone  (FLONASE ) 50 MCG/ACT nasal spray Place 2 sprays into both nostrils daily. 16 g 2   levocetirizine (XYZAL ) 5 MG tablet Take 1 tablet (5 mg total) by mouth every evening. 90 tablet 1   pseudoephedrine-guaifenesin (MUCINEX D) 60-600 MG 12 hr tablet Take 1 tablet by mouth every 12 (twelve) hours.     No current facility-administered medications for this visit.   Allergies: No Known Allergies I reviewed his past medical history, social history, family history, and environmental history and no significant changes have been reported from his previous visit.  ROS: All others negative except as noted per HPI.   Objective: BP 120/72 (BP Location: Left Arm, Patient Position: Sitting, Cuff Size: Large)   Pulse 60   Temp 98.4 F (36.9 C) (Oral)   Resp 20   Wt 215 lb (97.5 kg)   SpO2 98%   BMI 29.99 kg/m  Body mass index is 29.99 kg/m. General Appearance:  Alert, cooperative, no distress, appears stated age  Head:  Normocephalic, without obvious abnormality, atraumatic  Eyes:  Conjunctiva clear, EOM's intact  Nose: Nares normal, normal nasal mucosa, no polyps anteriorly, septum midline  Throat: Lips, tongue normal; teeth and gums normal,   Neck: Supple, symmetrical  Lungs:   , Respirations unlabored, no  coughing  Heart:  , Appears well perfused  Extremities: No edema  Skin: Skin color, texture, turgor normal, no rashes or lesions on visualized portions of skin  Neurologic: No gross deficits   Previous notes and tests were reviewed. The plan was reviewed with the patient/family, and all questions/concerned were addressed.  It was my pleasure to see Barry Campbell today and participate in his care. Please feel free to contact me with any questions or concerns.  Sincerely,  Barry Binet, MD  Allergy  & Immunology  [facility] [place]  [place] Office: [phone]

## 2024-01-09 DIAGNOSIS — J302 Other seasonal allergic rhinitis: Secondary | ICD-10-CM | POA: Diagnosis not present

## 2024-01-09 NOTE — Progress Notes (Signed)
 VIALS MADE 01-09-24

## 2024-01-10 DIAGNOSIS — J3081 Allergic rhinitis due to animal (cat) (dog) hair and dander: Secondary | ICD-10-CM | POA: Diagnosis not present

## 2024-01-15 ENCOUNTER — Ambulatory Visit (INDEPENDENT_AMBULATORY_CARE_PROVIDER_SITE_OTHER)

## 2024-01-15 DIAGNOSIS — J309 Allergic rhinitis, unspecified: Secondary | ICD-10-CM

## 2024-01-15 NOTE — Progress Notes (Signed)
 Immunotherapy   Patient Details  Name: Barry Campbell MRN: [id number] Date of Birth: 01-31-1956  01/15/2024  [name] here to pick up Red 1:100 (T-G-W-D and MOLD) Following schedule: C  Frequency:1 time per week Epi-Pen:Epi-Pen Available  Consent signed and patient instructions given. Patient states, he is still getting his injection from the [facility]. Per Dr. Jolayne Natter, [facility] was still getting his injections from there. Left message for call back. Dr. Jolayne Natter would like the nurse that is giving the injection to put initials.   [name] J [name] 01/15/2024, 9:51 AM

## 2024-05-13 ENCOUNTER — Encounter: Payer: Self-pay | Admitting: Internal Medicine

## 2024-05-13 ENCOUNTER — Ambulatory Visit: Admitting: Internal Medicine

## 2024-05-13 VITALS — BP 114/78 | HR 62 | Ht 71.0 in | Wt 218.3 lb

## 2024-05-13 DIAGNOSIS — H1013 Acute atopic conjunctivitis, bilateral: Secondary | ICD-10-CM | POA: Diagnosis not present

## 2024-05-13 DIAGNOSIS — J0100 Acute maxillary sinusitis, unspecified: Secondary | ICD-10-CM | POA: Diagnosis not present

## 2024-05-13 DIAGNOSIS — J302 Other seasonal allergic rhinitis: Secondary | ICD-10-CM

## 2024-05-13 DIAGNOSIS — T781XXD Other adverse food reactions, not elsewhere classified, subsequent encounter: Secondary | ICD-10-CM | POA: Diagnosis not present

## 2024-05-13 DIAGNOSIS — J3089 Other allergic rhinitis: Secondary | ICD-10-CM | POA: Diagnosis not present

## 2024-05-13 DIAGNOSIS — H1010 Acute atopic conjunctivitis, unspecified eye: Secondary | ICD-10-CM

## 2024-05-13 MED ORDER — METHYLPREDNISOLONE ACETATE 80 MG/ML IJ SUSP
80.0000 mg | Freq: Once | INTRAMUSCULAR | Status: AC
  Administered 2024-05-13: 40 mg via INTRAMUSCULAR

## 2024-05-13 MED ORDER — METHYLPREDNISOLONE ACETATE 40 MG/ML IJ SUSP: 40.0000 mg | Freq: Once | INTRAMUSCULAR | Status: AC

## 2024-05-13 MED ORDER — AMOXICILLIN-POT CLAVULANATE 875-125 MG PO TABS: 1.0000 | ORAL_TABLET | Freq: Two times a day (BID) | ORAL | 0 refills | Status: AC

## 2024-05-13 NOTE — Patient Instructions (Addendum)
 Allergic Rhinitis on AIT with acute sinus infection  Start Augmentin  875/125mg  twice daily for 14 days  Depo Medrol  40mg  IM given today   - Continue avoidance measures to grass, weeds, trees, roach, mold, cat - Take allegra (fexafenadine) 180mg  daily as needed and on shot days   - You can use an extra dose of the antihistamine, if needed, for breakthrough symptoms.  - Consider nasal saline rinses 1-2 times daily to remove allergens from the nasal cavities as well as help with mucous clearance (this is especially helpful to do before the nasal sprays are given) - Continue allergy  injections per protocol and carry epipen  on injection days   -vials ordered today, receiving in injections at [facility] in [place]   Adverse food reaction - Skin testing negative to shellfish, finned fish, mollusks today - This is a food intolerance, it is okay to try reintroducing this at home   Follow up:12 months   Thank you so much for letting me partake in your care today.  Don't hesitate to reach out if you have any additional concerns!  Hargis Springer, MD  [facility] [place] [place]

## 2024-05-13 NOTE — Addendum Note (Signed)
 Addended by: WILLIAMS, Shanora Christensen H on: 05/13/2024 01:44 PM   Modules accepted: Orders

## 2024-05-13 NOTE — Progress Notes (Signed)
 Follow Up Note  RE: Barry Campbell MRN: [id number] DOB: [birth year] Date of Office Visit: 05/13/2024  Referring provider: Montey Lot, PA-C Primary care provider: Montey Lot, PA-C  Chief Complaint: Follow-up (Patient believes he has sinus infection on right side. Teeth ache and feels like it is going back to his teeth)  History of Present Illness: I had the pleasure of seeing Barry Campbell for a follow up visit at the [facility] on 05/13/2024. He is a 68 y.o. male, who is being followed for allergic rhinitis, food intolerance. His previous allergy  office visit was on 12/04/22 with Dr. Lorin. Today is a regular follow up visit.  History obtained from patient, chart review.   Pertinent History/Diagnostics:   - Allergic Rhinitis:   - SPT environmental panel (01/31/2022): grass, weeds, trees, roach, mold, cat  - AIT through pulmonary.  Stopped in 2009 reports restarting me in 2012 through 2017.  Complete about 10 years but with home administration. Reports mild fatigue with some injections, denies SR, LLR.   - AIT restarted via [facility] (12/04/23): Vial 1 (mold), vial 2 (T-G-W-D)   - [facility]   Pertinent History/Diagnostics:  - Food Allergy  (tuna, sardines)  - Hx of reaction: negative to shellfish, finned fish, mollusks   - SPT select foods (01/31/22): negative to shellfish, finned fish, mollusks    Discussed the use of AI scribe software for clinical note transcription with the patient, who gave verbal consent to proceed.  History of Present Illness Barry Campbell is a 68 year old male who presents for vaccine pick-up and reports a sinus infection.  Sinus symptoms - Sinus infection persisting for a couple of months - Dental pain present - Small amount of phlegm production from one side - General malaise attributed to infection - mild pain with attempted ear popping  Allergen immunotherapy - Presents for vaccine pick-up as supply had  run out  -some confusion as he did not schedule an injection appointment to pick up vials.  He will need to come back next week to pick up vials.  - Last dose 1 week ago at [facility] 0.5mL.  Gave records today  - Receives allergy  shots at the [facility] in [place] - No issues with allergy  injections - No allergic reactions to immunotherapy - overall does feel like they are helping   Medication use and allergies - Not currently taking any medications, including antihistamines - Previously discussed taking Allegra on shot days to prevent allergic reactions, but not currently doing so - No known antibiotic allergies - Limited prior antibiotic use  Has not tried reintroducing seafood yet    Assessment and Plan: Barry Campbell is a 68 y.o. male with: Acute non-recurrent maxillary sinusitis  Seasonal and perennial allergic rhinitis  Adverse food reaction, subsequent encounter  Seasonal allergic conjunctivitis   Plan: Patient Instructions  Allergic Rhinitis on AIT with acute sinus infection  Start Augmentin  875/125mg  twice daily for 14 days  Depo Medrol  40mg  IM given today   - Continue avoidance measures to grass, weeds, trees, roach, mold, cat - Take allegra (fexafenadine) 180mg  daily as needed and on shot days   - You can use an extra dose of the antihistamine, if needed, for breakthrough symptoms.  - Consider nasal saline rinses 1-2 times daily to remove allergens from the nasal cavities as well as help with mucous clearance (this is especially helpful to do before the nasal sprays are given) - Continue allergy  injections per protocol and  carry epipen  on injection days   -vials ordered today, receiving in injections at [facility] in [place]   Adverse food reaction - Skin testing negative to shellfish, finned fish, mollusks today - This is a food intolerance, it is okay to try reintroducing this at home   Follow up:12 months   Thank you so much for letting me partake in  your care today.  Don't hesitate to reach out if you have any additional concerns!  Barry Springer, MD  [facility] [place] [place]   No orders of the defined types were placed in this encounter.   Lab Orders  No laboratory test(s) ordered today   Diagnostics: None done  Medication List:  Current Outpatient Medications  Medication Sig Dispense Refill   EPINEPHrine  0.3 mg/0.3 mL IJ SOAJ injection Inject 0.3 mg into the muscle as needed for anaphylaxis. 1 each 1   famotidine  (PEPCID ) 20 MG tablet Take 1 tab Monday morning and 1 evening, than take 1 tab Tuesday morning before [facility] appt. 4 tablet 0   fluticasone  (FLONASE ) 50 MCG/ACT nasal spray Place 2 sprays into both nostrils daily. 16 g 2   levocetirizine (XYZAL ) 5 MG tablet Take 1 tablet (5 mg total) by mouth every evening. 90 tablet 1   pseudoephedrine-guaifenesin (MUCINEX D) 60-600 MG 12 hr tablet Take 1 tablet by mouth every 12 (twelve) hours.     No current facility-administered medications for this visit.   Allergies: No Known Allergies I reviewed his past medical history, social history, family history, and environmental history and no significant changes have been reported from his previous visit.  ROS: All others negative except as noted per HPI.   Objective: BP 114/78 (BP Location: Right Arm, Patient Position: Sitting)   Pulse 62   Ht 5' 11 (1.803 m)   Wt 218 lb 4.8 oz (99 kg)   SpO2 97%   BMI 30.45 kg/m  Body mass index is 30.45 kg/m. General Appearance:  Alert, cooperative, no distress, appears stated age  Head:  Normocephalic, without obvious abnormality, atraumatic  Eyes:  Conjunctiva clear, EOM's intact  Nose: Nares normal, normal nasal mucosa, no polyps anteriorly, septum midline  Throat: Lips, tongue normal; teeth and gums normal,   Neck: Supple, symmetrical  Lungs:   , Respirations unlabored, no coughing  Heart:  , Appears well perfused  Extremities: No edema  Skin: Skin color,  texture, turgor normal, no rashes or lesions on visualized portions of skin  Neurologic: No gross deficits   Previous notes and tests were reviewed. The plan was reviewed with the patient/family, and all questions/concerned were addressed.  It was my pleasure to see Sebert today and participate in his care. Please feel free to contact me with any questions or concerns.  Sincerely,  Barry Springer, MD  Allergy  & Immunology  [facility]  [place] Office: [phone]

## 2024-05-13 NOTE — Addendum Note (Signed)
 Addended by: WILLIAMS, Rody Keadle H on: 05/13/2024 03:30 PM   Modules accepted: Orders

## 2024-05-13 NOTE — Addendum Note (Signed)
 Addended by: Marquinn Meschke M on: 05/13/2024 01:27 PM   Modules accepted: Orders

## 2024-05-14 DIAGNOSIS — J302 Other seasonal allergic rhinitis: Secondary | ICD-10-CM | POA: Diagnosis not present

## 2024-05-14 NOTE — Progress Notes (Signed)
 VIALS MADE 05-14-24

## 2024-05-15 DIAGNOSIS — J301 Allergic rhinitis due to pollen: Secondary | ICD-10-CM | POA: Diagnosis not present

## 2024-05-15 DIAGNOSIS — J3081 Allergic rhinitis due to animal (cat) (dog) hair and dander: Secondary | ICD-10-CM | POA: Diagnosis not present

## 2024-05-22 ENCOUNTER — Ambulatory Visit

## 2024-05-22 DIAGNOSIS — J309 Allergic rhinitis, unspecified: Secondary | ICD-10-CM | POA: Diagnosis not present

## 2024-07-14 ENCOUNTER — Other Ambulatory Visit: Payer: Self-pay | Admitting: Internal Medicine

## 2024-09-14 DIAGNOSIS — J302 Other seasonal allergic rhinitis: Secondary | ICD-10-CM | POA: Diagnosis not present

## 2024-09-14 NOTE — Progress Notes (Signed)
 VIALS MADE ON 09/14/24

## 2024-09-15 DIAGNOSIS — J3081 Allergic rhinitis due to animal (cat) (dog) hair and dander: Secondary | ICD-10-CM | POA: Diagnosis not present

## 2024-09-15 DIAGNOSIS — J301 Allergic rhinitis due to pollen: Secondary | ICD-10-CM | POA: Diagnosis not present

## 2024-09-28 ENCOUNTER — Ambulatory Visit

## 2024-09-29 ENCOUNTER — Telehealth: Payer: Self-pay

## 2024-09-29 ENCOUNTER — Ambulatory Visit

## 2024-09-29 DIAGNOSIS — J302 Other seasonal allergic rhinitis: Secondary | ICD-10-CM

## 2024-09-29 NOTE — Telephone Encounter (Signed)
 Patient dropped off injection paperwork but it is suspected that patient may be self administering injections instead of receiving them at the [facility] in [place]. Barry D. Spoke with Dr. Lorin who requested that we call the office to confirm he is receiving weekly injections by them. [facility] is closed Monday & Tuesday. VM was left for them to return our call to confirm whether patient is receiving his shots.

## 2024-09-29 NOTE — Progress Notes (Signed)
 Pt brought paperwork and empty vials. He received 0.1 Mold and 0.1 of T-G-W-D in new red vials. Provided patient with new paperwork and confirmed he was taking paperwork to [facility] in [place].

## 2025-05-12 ENCOUNTER — Ambulatory Visit: Admitting: Internal Medicine
